# Patient Record
Sex: Female | Born: 1960
Health system: Southern US, Community
[De-identification: ages and names within clinical notes are randomized; demographics above are authoritative.]

## PROBLEM LIST (undated history)

## (undated) DIAGNOSIS — R519 Headache, unspecified: Secondary | ICD-10-CM

## (undated) DIAGNOSIS — F32A Depression, unspecified: Secondary | ICD-10-CM

## (undated) DIAGNOSIS — F419 Anxiety disorder, unspecified: Secondary | ICD-10-CM

## (undated) DIAGNOSIS — G4700 Insomnia, unspecified: Secondary | ICD-10-CM

## (undated) DIAGNOSIS — E7800 Pure hypercholesterolemia, unspecified: Secondary | ICD-10-CM

## (undated) DIAGNOSIS — E669 Obesity, unspecified: Secondary | ICD-10-CM

## (undated) DIAGNOSIS — B019 Varicella without complication: Secondary | ICD-10-CM

## (undated) DIAGNOSIS — I10 Essential (primary) hypertension: Secondary | ICD-10-CM

## (undated) DIAGNOSIS — K760 Fatty (change of) liver, not elsewhere classified: Secondary | ICD-10-CM

## (undated) DIAGNOSIS — K219 Gastro-esophageal reflux disease without esophagitis: Secondary | ICD-10-CM

## (undated) DIAGNOSIS — Z87442 Personal history of urinary calculi: Secondary | ICD-10-CM

## (undated) DIAGNOSIS — R51 Headache: Secondary | ICD-10-CM

## (undated) HISTORY — DX: Varicella without complication: B01.9

## (undated) HISTORY — PX: TUBAL LIGATION: SHX77

---

## 2006-08-05 ENCOUNTER — Ambulatory Visit: Payer: Self-pay | Admitting: Emergency Medicine

## 2006-11-16 ENCOUNTER — Ambulatory Visit: Payer: Self-pay | Admitting: Family Medicine

## 2011-03-24 ENCOUNTER — Ambulatory Visit: Payer: Self-pay

## 2011-10-11 DIAGNOSIS — Z803 Family history of malignant neoplasm of breast: Secondary | ICD-10-CM | POA: Insufficient documentation

## 2014-05-12 ENCOUNTER — Ambulatory Visit: Payer: Self-pay | Admitting: Emergency Medicine

## 2014-07-13 ENCOUNTER — Other Ambulatory Visit: Payer: Self-pay | Admitting: Family Medicine

## 2014-07-13 DIAGNOSIS — M542 Cervicalgia: Secondary | ICD-10-CM

## 2014-12-03 HISTORY — PX: CERVICAL FUSION: SHX112

## 2014-12-21 ENCOUNTER — Ambulatory Visit: Payer: Federal, State, Local not specified - PPO | Admitting: Family Medicine

## 2014-12-28 ENCOUNTER — Encounter: Payer: Self-pay | Admitting: Family Medicine

## 2014-12-28 ENCOUNTER — Ambulatory Visit (INDEPENDENT_AMBULATORY_CARE_PROVIDER_SITE_OTHER): Payer: Federal, State, Local not specified - PPO | Admitting: Family Medicine

## 2014-12-28 VITALS — BP 134/82 | HR 84 | Temp 97.7°F | Ht 62.25 in | Wt 192.5 lb

## 2014-12-28 DIAGNOSIS — Z13220 Encounter for screening for lipoid disorders: Secondary | ICD-10-CM | POA: Diagnosis not present

## 2014-12-28 DIAGNOSIS — I10 Essential (primary) hypertension: Secondary | ICD-10-CM | POA: Insufficient documentation

## 2014-12-28 DIAGNOSIS — E669 Obesity, unspecified: Secondary | ICD-10-CM

## 2014-12-28 DIAGNOSIS — Z130 Encounter for screening for diseases of the blood and blood-forming organs and certain disorders involving the immune mechanism: Secondary | ICD-10-CM

## 2014-12-28 DIAGNOSIS — Z1321 Encounter for screening for nutritional disorder: Secondary | ICD-10-CM

## 2014-12-28 DIAGNOSIS — Z0000 Encounter for general adult medical examination without abnormal findings: Secondary | ICD-10-CM | POA: Diagnosis not present

## 2014-12-28 DIAGNOSIS — Z8679 Personal history of other diseases of the circulatory system: Secondary | ICD-10-CM

## 2014-12-28 DIAGNOSIS — Z1211 Encounter for screening for malignant neoplasm of colon: Secondary | ICD-10-CM

## 2014-12-28 DIAGNOSIS — Z981 Arthrodesis status: Secondary | ICD-10-CM

## 2014-12-28 DIAGNOSIS — Z23 Encounter for immunization: Secondary | ICD-10-CM

## 2014-12-28 LAB — CBC
HCT: 44.2 % (ref 36.0–46.0)
Hemoglobin: 14.5 g/dL (ref 12.0–15.0)
MCHC: 32.9 g/dL (ref 30.0–36.0)
MCV: 89.5 fl (ref 78.0–100.0)
Platelets: 183 10*3/uL (ref 150.0–400.0)
RBC: 4.94 Mil/uL (ref 3.87–5.11)
RDW: 14.6 % (ref 11.5–15.5)
WBC: 5.8 10*3/uL (ref 4.0–10.5)

## 2014-12-28 LAB — LIPID PANEL
Cholesterol: 186 mg/dL (ref 0–200)
HDL: 51.9 mg/dL (ref >39.00)
LDL Cholesterol: 105 mg/dL — ABNORMAL HIGH (ref 0–99)
NonHDL: 133.78
Total CHOL/HDL Ratio: 4
Triglycerides: 143 mg/dL (ref 0.0–149.0)
VLDL: 28.6 mg/dL (ref 0.0–40.0)

## 2014-12-28 LAB — COMPREHENSIVE METABOLIC PANEL
ALT: 20 U/L (ref 0–35)
AST: 19 U/L (ref 0–37)
Albumin: 4.1 g/dL (ref 3.5–5.2)
Alkaline Phosphatase: 89 U/L (ref 39–117)
BUN: 21 mg/dL (ref 6–23)
CO2: 29 mEq/L (ref 19–32)
Calcium: 9.4 mg/dL (ref 8.4–10.5)
Chloride: 108 mEq/L (ref 96–112)
Creatinine, Ser: 0.8 mg/dL (ref 0.40–1.20)
GFR: 79.3 mL/min (ref >60.00)
Glucose, Bld: 80 mg/dL (ref 70–99)
Potassium: 4.4 mEq/L (ref 3.5–5.1)
Sodium: 144 mEq/L (ref 135–145)
Total Bilirubin: 0.5 mg/dL (ref 0.2–1.2)
Total Protein: 7.1 g/dL (ref 6.0–8.3)

## 2014-12-28 LAB — TSH: TSH: 1.49 u[IU]/mL (ref 0.35–4.50)

## 2014-12-28 LAB — HEMOGLOBIN A1C: Hgb A1c MFr Bld: 5.2 % (ref 4.6–6.5)

## 2014-12-28 LAB — VITAMIN D 25 HYDROXY (VIT D DEFICIENCY, FRACTURES): VITD: 13.83 ng/mL — ABNORMAL LOW (ref 30.00–100.00)

## 2014-12-28 NOTE — Assessment & Plan Note (Signed)
Tetanus given today. Obtaining flu shot at work. Labs today: CBC, CMP, lipid, TSH, A1c, vitamin D. Briefly discussed diet. Handout given. Advised clearance from neurosurgeon prior to exercise. Pap smear up-to-date. Mammogram next month. Placing order for colonoscopy.

## 2014-12-28 NOTE — Assessment & Plan Note (Signed)
Prehypertensive today. Will monitor closely.

## 2014-12-28 NOTE — Progress Notes (Signed)
Pre visit review using our clinic review tool, if applicable. No additional management support is needed unless otherwise documented below in the visit note. 

## 2014-12-28 NOTE — Progress Notes (Signed)
Subjective:  Patient ID: Tammy Gregory, female    DOB: 1960/07/21  Age: 54 y.o. MRN: [id number]  CC: Establish care  HPI Tammy Gregory is a 54 y.o. female presents to the clinic today to establish care.  Preventative Healthcare  Pap smear: 07/2013.  Mammogram/Breast exam: Has upcoming mammogram in November.  Colonoscopy: In need of.  Immunizations: Will get flu at work. In need of tetanus today.  Labs: In need of screening labs today. See orders.  Diet/Exercise: Has been eating poorly and has been unable to exercise following C spine surgery 08/2014.  Alcohol use: See below.   Smoking/tobacco use: No.  STD/HIV testing: Has had prior testing.   Wears seat belt: Yes.   PMH, Surgical Hx, Family Hx, Social History reviewed and updated as below. Past Medical History  Diagnosis Date  . Chicken pox    Past Surgical History  Procedure Laterality Date  . Cesarean section      3  . Cervical fusion  12/03/2014   Family History  Problem Relation Age of Onset  . Hypertension Father    Social History  Substance Use Topics  . Smoking status: Never Smoker   . Smokeless tobacco: Never Used  . Alcohol Use: 0.6 - 1.2 oz/week    1-2 Standard drinks or equivalent per week   Review of Systems  HENT: Positive for trouble swallowing.   Gastrointestinal: Positive for constipation.  Musculoskeletal:       Joint pain. Muscle pain.  Neurological: Positive for headaches.  Psychiatric/Behavioral: The patient is nervous/anxious.    All other systems negative.   Objective:   Today's Vitals: BP 134/82 mmHg  Pulse 84  Temp(Src) 97.7 F (36.5 C) (Oral)  Ht 5' 2.25" (1.581 m)  Wt 192 lb 8 oz (87.317 kg)  BMI 34.93 kg/m2  SpO2 97%  Physical Exam  Constitutional: She is oriented to person, place, and time. She appears well-developed and well-nourished. No distress.  HENT:  Head: Normocephalic and atraumatic.  Nose: Nose normal.  Mouth/Throat: Oropharynx is clear  and moist. No oropharyngeal exudate.  Normal TM's bilaterally.   Eyes: Conjunctivae are normal. No scleral icterus.  Neck: Neck supple. No thyromegaly present.  Surgical scar from recent cervical fusion is well-healed.  Cardiovascular: Normal rate and regular rhythm.   No murmur heard. Pulmonary/Chest: Effort normal and breath sounds normal. She has no wheezes. She has no rales.  Abdominal: Soft. She exhibits no distension. There is no tenderness. There is no rebound and no guarding.  Musculoskeletal: Normal range of motion. She exhibits no edema.  Lymphadenopathy:    She has no cervical adenopathy.  Neurological: She is alert and oriented to person, place, and time.  Skin: Skin is warm and dry. No rash noted.  Psychiatric: She has a normal mood and affect.  Vitals reviewed.  Assessment & Plan:   Problem List Items Addressed This Visit    History of hypertension    Prehypertensive today. Will monitor closely.       Relevant Orders   Comprehensive metabolic panel   Obesity (BMI 35.0-39.9 without comorbidity) ([place])   Relevant Orders   Hemoglobin A1c   TSH   Preventative health care - Primary    Tetanus given today. Obtaining flu shot at work. Labs today: CBC, CMP, lipid, TSH, A1c, vitamin D. Briefly discussed diet. Handout given. Advised clearance from neurosurgeon prior to exercise. Pap smear up-to-date. Mammogram next month. Placing order for colonoscopy.      S/P cervical  spinal fusion    Other Visit Diagnoses    Screening for deficiency anemia        Relevant Orders    CBC    Screening for lipid disorders        Relevant Orders    Lipid panel    Encounter for vitamin deficiency screening        Relevant Orders    Vitamin D (25 hydroxy)    Need for prophylactic vaccination with combined diphtheria-tetanus-pertussis (DTP) vaccine        Relevant Orders    Tdap vaccine greater than or equal to 7yo IM (Completed)    Encounter for screening colonoscopy         Relevant Orders    Ambulatory referral to Gastroenterology      Outpatient Encounter Prescriptions as of 12/28/2014  Medication Sig  . diclofenac (VOLTAREN) 75 MG EC tablet Take 75 mg by mouth 2 (two) times daily.  Marland Kitchen gabapentin (NEURONTIN) 300 MG capsule 3 (three) times daily.  . methocarbamol (ROBAXIN) 500 MG tablet 4 (four) times daily.  . traMADol (ULTRAM) 50 MG tablet Take 50 mg by mouth 3 (three) times daily.   No facility-administered encounter medications on file as of 12/28/2014.    Follow-up: 6 months.    Coral Spikes DO

## 2014-12-28 NOTE — Patient Instructions (Signed)
I was nice to see you today.  Continue your current medications. Be cautious about long term use of the diclofenac (this is an NSAID and can cause kidney damage and GI bleed).  Follow up in ~ 6 months or sooner if needed.   Take care  Dr. Lacinda Axon

## 2015-02-08 ENCOUNTER — Other Ambulatory Visit: Payer: Self-pay | Admitting: Family Medicine

## 2015-02-08 ENCOUNTER — Encounter: Payer: Self-pay | Admitting: Family Medicine

## 2015-02-08 MED ORDER — VITAMIN D (ERGOCALCIFEROL) 1.25 MG (50000 UNIT) PO CAPS: 50000.0000 [IU] | ORAL_CAPSULE | ORAL | Status: DC

## 2015-03-23 ENCOUNTER — Ambulatory Visit: Payer: Federal, State, Local not specified - PPO | Admitting: Physical Therapy

## 2015-03-25 ENCOUNTER — Encounter: Payer: Federal, State, Local not specified - PPO | Admitting: Physical Therapy

## 2015-03-30 ENCOUNTER — Encounter: Payer: Federal, State, Local not specified - PPO | Admitting: Physical Therapy

## 2015-04-01 ENCOUNTER — Encounter: Payer: Federal, State, Local not specified - PPO | Admitting: Physical Therapy

## 2015-04-01 ENCOUNTER — Encounter: Payer: Self-pay | Admitting: *Deleted

## 2015-04-02 ENCOUNTER — Ambulatory Visit: Payer: Federal, State, Local not specified - PPO | Admitting: Certified Registered Nurse Anesthetist

## 2015-04-02 ENCOUNTER — Encounter
Admission: RE | Disposition: A | Discharge status: Home / Self Care | Payer: Self-pay | Source: Ambulatory Visit | Attending: Gastroenterology

## 2015-04-02 ENCOUNTER — Encounter: Payer: Self-pay | Admitting: *Deleted

## 2015-04-02 ENCOUNTER — Ambulatory Visit
Admission: RE | Admit: 2015-04-02 | Discharge: 2015-04-02 | Disposition: A | Discharge status: Home / Self Care | Payer: Federal, State, Local not specified - PPO | Source: Ambulatory Visit | Attending: Gastroenterology | Admitting: Gastroenterology

## 2015-04-02 DIAGNOSIS — R51 Headache: Secondary | ICD-10-CM | POA: Insufficient documentation

## 2015-04-02 DIAGNOSIS — I10 Essential (primary) hypertension: Secondary | ICD-10-CM | POA: Insufficient documentation

## 2015-04-02 DIAGNOSIS — D127 Benign neoplasm of rectosigmoid junction: Secondary | ICD-10-CM | POA: Diagnosis not present

## 2015-04-02 DIAGNOSIS — Z981 Arthrodesis status: Secondary | ICD-10-CM | POA: Insufficient documentation

## 2015-04-02 DIAGNOSIS — Z1211 Encounter for screening for malignant neoplasm of colon: Secondary | ICD-10-CM | POA: Diagnosis not present

## 2015-04-02 DIAGNOSIS — Z800 Family history of malignant neoplasm of digestive organs: Secondary | ICD-10-CM | POA: Diagnosis not present

## 2015-04-02 DIAGNOSIS — Z880 Allergy status to penicillin: Secondary | ICD-10-CM | POA: Diagnosis not present

## 2015-04-02 DIAGNOSIS — K621 Rectal polyp: Secondary | ICD-10-CM | POA: Diagnosis not present

## 2015-04-02 DIAGNOSIS — Z79899 Other long term (current) drug therapy: Secondary | ICD-10-CM | POA: Diagnosis not present

## 2015-04-02 DIAGNOSIS — K5730 Diverticulosis of large intestine without perforation or abscess without bleeding: Secondary | ICD-10-CM | POA: Insufficient documentation

## 2015-04-02 HISTORY — PX: COLONOSCOPY WITH PROPOFOL: SHX5780

## 2015-04-02 HISTORY — DX: Essential (primary) hypertension: I10

## 2015-04-02 HISTORY — DX: Headache: R51

## 2015-04-02 HISTORY — DX: Headache, unspecified: R51.9

## 2015-04-02 LAB — HM COLONOSCOPY: HM Colonoscopy: 33

## 2015-04-02 SURGERY — COLONOSCOPY WITH PROPOFOL
Anesthesia: General

## 2015-04-02 MED ORDER — SODIUM CHLORIDE 0.9 % IV SOLN
INTRAVENOUS | Status: DC
  Administered 2015-04-02: 09:00:00 via INTRAVENOUS

## 2015-04-02 MED ORDER — PROPOFOL 500 MG/50ML IV EMUL
INTRAVENOUS | Status: DC | PRN
  Administered 2015-04-02: 140 ug/kg/min via INTRAVENOUS

## 2015-04-02 MED ORDER — SODIUM CHLORIDE 0.9 % IV SOLN: INTRAVENOUS | Status: DC

## 2015-04-02 MED ORDER — MIDAZOLAM HCL 2 MG/2ML IJ SOLN
INTRAMUSCULAR | Status: DC | PRN
  Administered 2015-04-02: 1 mg via INTRAVENOUS

## 2015-04-02 MED ORDER — LIDOCAINE HCL (CARDIAC) 20 MG/ML IV SOLN
INTRAVENOUS | Status: DC | PRN
  Administered 2015-04-02: 60 mg via INTRAVENOUS

## 2015-04-02 MED ORDER — PROPOFOL 10 MG/ML IV BOLUS
INTRAVENOUS | Status: DC | PRN
Start: 2015-04-02 — End: 2015-04-02
  Administered 2015-04-02: 30 mg via INTRAVENOUS
  Administered 2015-04-02: 20 mg via INTRAVENOUS
  Administered 2015-04-02: 30 mg via INTRAVENOUS

## 2015-04-02 NOTE — Anesthesia Preprocedure Evaluation (Signed)
Anesthesia Evaluation  Patient identified by MRN, date of birth, ID band Patient awake    Reviewed: Allergy & Precautions, H&P , NPO status , Patient's Chart, lab work & pertinent test results  History of Anesthesia Complications Negative for: history of anesthetic complications  Airway Mallampati: II  TM Distance: >3 FB Neck ROM: limited    Dental  (+) Poor Dentition   Pulmonary neg pulmonary ROS, neg shortness of breath,    Pulmonary exam normal breath sounds clear to auscultation       Cardiovascular Exercise Tolerance: Good hypertension, (-) angina(-) Past MI and (-) DOE Normal cardiovascular exam Rhythm:regular Rate:Normal     Neuro/Psych  Headaches, negative psych ROS   GI/Hepatic negative GI ROS, Neg liver ROS, neg GERD  ,  Endo/Other  negative endocrine ROS  Renal/GU negative Renal ROS  negative genitourinary   Musculoskeletal   Abdominal   Peds  Hematology negative hematology ROS (+)   Anesthesia Other Findings Past Medical History:   Chicken pox                                                  Hypertension                                                 Headache                                                    Past Surgical History:   CESAREAN SECTION                                                Comment:3   CERVICAL FUSION                                  12/03/2014   BMI    Body Mass Index   35.10 kg/m 2      Reproductive/Obstetrics negative OB ROS                             Anesthesia Physical Anesthesia Plan  ASA: III  Anesthesia Plan: General   Post-op Pain Management:    Induction:   Airway Management Planned:   Additional Equipment:   Intra-op Plan:   Post-operative Plan:   Informed Consent: I have reviewed the patients History and Physical, chart, labs and discussed the procedure including the risks, benefits and alternatives for the proposed  anesthesia with the patient or authorized representative who has indicated his/her understanding and acceptance.   Dental Advisory Given  Plan Discussed with: Anesthesiologist, CRNA and Surgeon  Anesthesia Plan Comments:         Anesthesia Quick Evaluation

## 2015-04-02 NOTE — Anesthesia Postprocedure Evaluation (Signed)
Anesthesia Post Note  Patient: Tammy Gregory  Procedure(s) Performed: Procedure(s) (LRB): COLONOSCOPY WITH PROPOFOL (N/A)  Patient location during evaluation: Endoscopy Anesthesia Type: General Level of consciousness: awake and alert Pain management: pain level controlled Vital Signs Assessment: post-procedure vital signs reviewed and stable Respiratory status: spontaneous breathing, nonlabored ventilation, respiratory function stable and patient connected to nasal cannula oxygen Cardiovascular status: blood pressure returned to baseline and stable Postop Assessment: no signs of nausea or vomiting Anesthetic complications: no    Last Vitals:  Filed Vitals:   04/02/15 1005 04/02/15 1015  BP: 160/80 163/90  Pulse: 58 62  Temp:    Resp: 24 20    Last Pain: There were no vitals filed for this visit.               Precious Haws Latesia Norrington

## 2015-04-02 NOTE — H&P (Signed)
Outpatient short stay form Pre-procedure 04/02/2015 9:03 AM Tammy Sails MD  Primary Physician: Dr Thersa Salt  Reason for visit:  Screening colonoscopy  History of present illness:  Patient is a 55 year old female presenting today for her initial screening colonoscopy. She tolerated her prep well. She takes no aspirin or blood thinning products. Family history is unclear in regards to some type of stomach cancer.    Current facility-administered medications:  .  0.9 %  sodium chloride infusion, , Intravenous, Continuous, Tammy Sails, MD, Last Rate: 20 mL/hr at 04/02/15 0839 .  0.9 %  sodium chloride infusion, , Intravenous, Continuous, Tammy Sails, MD  Prescriptions prior to admission  Medication Sig Dispense Refill Last Dose  . gabapentin (NEURONTIN) 300 MG capsule 3 (three) times daily.   04/01/2015 at 1100  . diclofenac (VOLTAREN) 75 MG EC tablet Take 75 mg by mouth 2 (two) times daily.     . methocarbamol (ROBAXIN) 500 MG tablet 4 (four) times daily.     . traMADol (ULTRAM) 50 MG tablet Take 50 mg by mouth 3 (three) times daily.  1   . Vitamin D, Ergocalciferol, (DRISDOL) 50000 UNITS CAPS capsule Take 1 capsule (50,000 Units total) by mouth every 7 (seven) days. 8 capsule 0      Allergies  Allergen Reactions  . Codeine Nausea Only, Other (See Comments) and Nausea And Vomiting    Other Reaction: DIZZINESS.  Marland Kitchen Amoxicillin Diarrhea  . Clindamycin Other (See Comments)     Past Medical History  Diagnosis Date  . Chicken pox   . Hypertension   . Headache     Review of systems:      Physical Exam    Heart and lungs: Regular rate and rhythm without rub or gallop, lungs are bilaterally clear.    HEENT: Norm septic atraumatic eyes are anicteric    Other:     Pertinant exam for procedure: Soft nontender nondistended bowel sounds positive normoactive.    Planned proceedures: Colonoscopy and indicated procedures. I have discussed the risks benefits and  complications of procedures to include not limited to bleeding, infection, perforation and the risk of sedation and the patient wishes to proceed.    Tammy Sails, MD Gastroenterology 04/02/2015  9:03 AM

## 2015-04-02 NOTE — Anesthesia Procedure Notes (Signed)
Date/Time: 04/02/2015 9:11 AM Performed by: Johnna Acosta Pre-anesthesia Checklist: Patient identified, Emergency Drugs available, Suction available and Patient being monitored Patient Re-evaluated:Patient Re-evaluated prior to inductionOxygen Delivery Method: Nasal cannula

## 2015-04-02 NOTE — Transfer of Care (Signed)
Immediate Anesthesia Transfer of Care Note  Patient: Tammy Gregory  Procedure(s) Performed: Procedure(s): COLONOSCOPY WITH PROPOFOL (N/A)  Patient Location: PACU  Anesthesia Type:General  Level of Consciousness: sedated  Airway & Oxygen Therapy: Patient Spontanous Breathing and Patient connected to nasal cannula oxygen  Post-op Assessment: Report given to RN and Post -op Vital signs reviewed and stable  Post vital signs: Reviewed and stable  Last Vitals:  Filed Vitals:   04/02/15 0820  BP: 139/85  Pulse: 75  Temp: 37.1 C  Resp: 16    Complications: No apparent anesthesia complications

## 2015-04-02 NOTE — Op Note (Signed)
[facility] Gastroenterology Patient Name: Marion Coxe Procedure Date: 04/02/2015 9:09 AM MRN: [id number] Account #: [id number] Date of Birth: 17-Apr-1960 Admit Type: Outpatient Age: 55 Room: [facility] ENDO ROOM 3 Gender: Female Note Status: Finalized Procedure:         Colonoscopy Indications:       Screening for colorectal malignant neoplasm, This is the                     patient's first colonoscopy Providers:         Lollie Sails, MD Referring MD:      Barnie Del. Lacinda Axon (Referring MD) Medicines:         Monitored Anesthesia Care Complications:     No immediate complications. Procedure:         Pre-Anesthesia Assessment:                    - ASA Grade Assessment: II - A patient with mild systemic                     disease.                    After obtaining informed consent, the colonoscope was                     passed under direct vision. Throughout the procedure, the                     patient's blood pressure, pulse, and oxygen saturations                     were monitored continuously. The Colonoscope was                     introduced through the anus and advanced to the the cecum,                     identified by appendiceal orifice and ileocecal valve. The                     colonoscopy was performed with moderate difficulty due to                     poor bowel prep and restricted mobility of the colon.                     Successful completion of the procedure was aided by                     applying abdominal pressure and lavage. The patient                     tolerated the procedure well. The quality of the bowel                     preparation was fair. Findings:      A 3 mm polyp was found in the recto-sigmoid colon. The polyp was       sessile. The polyp was removed with a cold biopsy forceps. Resection and       retrieval were complete.      A 4 mm polyp was found in the rectum. The polyp was semi-pedunculated.       The polyp  was removed with a cold  snare. Resection and retrieval were       complete.      Multiple medium-mouthed diverticula were found in the sigmoid colon, in       the descending colon and in the transverse colon.      The retroflexed view of the distal rectum and anal verge was normal and       showed no anal or rectal abnormalities.      The exam was otherwise without abnormality.      The digital rectal exam was normal. Impression:        - One 3 mm polyp at the recto-sigmoid colon. Resected and                     retrieved.                    - One 4 mm polyp in the rectum. Resected and retrieved.                    - Diverticulosis in the sigmoid colon, in the descending                     colon and in the transverse colon.                    - The distal rectum and anal verge are normal on                     retroflexion view.                    - The examination was otherwise normal. Recommendation:    - Discharge patient to home.                    - Telephone GI clinic for pathology results in 1 week. Procedure Code(s): --- Professional ---                    [phone], Colonoscopy, flexible; with removal of tumor(s),                     polyp(s), or other lesion(s) by snare technique                    45380, 80, Colonoscopy, flexible; with biopsy, single or                     multiple Diagnosis Code(s): --- Professional ---                    Z12.11, Encounter for screening for malignant neoplasm of                     colon                    D12.7, Benign neoplasm of rectosigmoid junction                    K62.1, Rectal polyp                    K57.30, Diverticulosis of large intestine without                     perforation or abscess without bleeding CPT copyright 2014 American Medical Association. All rights reserved. The codes documented in  this report are preliminary and upon coder review may  be revised to meet current compliance requirements. Lollie Sails,  MD 04/02/2015 9:43:01 AM This report has been signed electronically. Number of Addenda: 0 Note Initiated On: 04/02/2015 9:09 AM Scope Withdrawal Time: 0 hours 12 minutes 24 seconds  Total Procedure Duration: 0 hours 22 minutes 22 seconds       [facility]

## 2015-04-03 ENCOUNTER — Encounter: Payer: Self-pay | Admitting: Gastroenterology

## 2015-04-05 LAB — SURGICAL PATHOLOGY

## 2015-04-06 ENCOUNTER — Encounter: Payer: Federal, State, Local not specified - PPO | Admitting: Physical Therapy

## 2015-04-08 ENCOUNTER — Encounter: Payer: Federal, State, Local not specified - PPO | Admitting: Physical Therapy

## 2015-04-09 ENCOUNTER — Encounter: Payer: Self-pay | Admitting: Family Medicine

## 2015-06-28 ENCOUNTER — Ambulatory Visit: Payer: Federal, State, Local not specified - PPO | Admitting: Family Medicine

## 2015-07-23 ENCOUNTER — Encounter: Payer: Self-pay | Admitting: Family Medicine

## 2015-07-23 ENCOUNTER — Ambulatory Visit (INDEPENDENT_AMBULATORY_CARE_PROVIDER_SITE_OTHER): Payer: Federal, State, Local not specified - PPO | Admitting: Family Medicine

## 2015-07-23 VITALS — BP 146/94 | HR 74 | Temp 98.3°F | Ht 62.25 in | Wt 201.0 lb

## 2015-07-23 DIAGNOSIS — R635 Abnormal weight gain: Secondary | ICD-10-CM

## 2015-07-23 DIAGNOSIS — F320 Major depressive disorder, single episode, mild: Secondary | ICD-10-CM

## 2015-07-23 DIAGNOSIS — F329 Major depressive disorder, single episode, unspecified: Secondary | ICD-10-CM | POA: Diagnosis not present

## 2015-07-23 DIAGNOSIS — F32A Depression, unspecified: Secondary | ICD-10-CM

## 2015-07-23 DIAGNOSIS — F419 Anxiety disorder, unspecified: Secondary | ICD-10-CM | POA: Insufficient documentation

## 2015-07-23 NOTE — Progress Notes (Signed)
   Subjective:  Patient ID: Tammy Gregory, female    DOB: 1960-09-23  Age: 56 y.o. MRN: [id number]  CC: Lack of motivation/drive, Discuss weight  HPI:  55 year old female with obesity and history of hypertension presents with the above complaints.  Patient states that she has recently been experiencing lack of motivation and drive. Patient states that she is uninterested in doing things. Doesn't want to go to the gym. Is not cooking very much (she enjoys this). She's feeling down. She states she's worried about her husband in his health issues. No other new stressors. No known relieving factors. No other exacerbating factors other than her husband's health.  Additionally, patient states that she has gained weight over the past several months. She states that she's eating poorly and is not exercising. She would like to discuss treatment options today.  Social Hx   Social History   Social History  . Marital Status: Married    Spouse Name: N/A  . Number of Children: N/A  . Years of Education: N/A   Social History Main Topics  . Smoking status: Never Smoker   . Smokeless tobacco: Never Used  . Alcohol Use: 0.6 - 1.2 oz/week    1-2 Standard drinks or equivalent per week  . Drug Use: No  . Sexual Activity:    Partners: Male   Other Topics Concern  . None   Social History Narrative   Review of Systems  Constitutional:       Weight gain.  Psychiatric/Behavioral:       Lack of motivation/drive.   Objective:  BP 146/94 mmHg  Pulse 74  Temp(Src) 98.3 F (36.8 C) (Oral)  Ht 5' 2.25" (1.581 m)  Wt 201 lb (91.173 kg)  BMI 36.48 kg/m2  SpO2 96%  LMP 04/02/2007 (Approximate)  BP/Weight 07/23/2015 04/02/2015 [id number]  Systolic BP [id number] [id number]  Diastolic BP 94 90 82  Wt. (Lbs) 201 192 192.5  BMI 36.48 35.11 34.93   Physical Exam  Constitutional: She is oriented to person, place, and time. She appears well-developed. No distress.  Cardiovascular: Normal rate and  regular rhythm.   Pulmonary/Chest: Effort normal. She has no wheezes. She has no rales.  Neurological: She is alert and oriented to person, place, and time.  Psychiatric: She has a normal mood and affect.  Vitals reviewed.  Lab Results  Component Value Date   WBC 5.8 12/28/2014   HGB 14.5 12/28/2014   HCT 44.2 12/28/2014   PLT 183.0 12/28/2014   GLUCOSE 80 12/28/2014   CHOL 186 12/28/2014   TRIG 143.0 12/28/2014   HDL 51.90 12/28/2014   LDLCALC 105* 12/28/2014   ALT 20 12/28/2014   AST 19 12/28/2014   NA 144 12/28/2014   K 4.4 12/28/2014   CL 108 12/28/2014   CREATININE 0.80 12/28/2014   BUN 21 12/28/2014   CO2 29 12/28/2014   TSH 1.49 12/28/2014   HGBA1C 5.2 12/28/2014   PHQ-9 - 9.   Assessment & Plan:   Problem List Items Addressed This Visit    Weight gain    New problem. Discussed diet and exercise. Sending to nutrition.      Mild depression - Primary    New problem. Patient with symptoms of depression. PHQ9 - 9. Discussed today. Offered treatment and patient declined.        Follow-up: 3 months.  [place]

## 2015-07-23 NOTE — Assessment & Plan Note (Signed)
New problem. Patient with symptoms of depression. PHQ9 - 9. Discussed today. Offered treatment and patient declined.

## 2015-07-23 NOTE — Assessment & Plan Note (Signed)
New problem. Discussed diet and exercise. Sending to nutrition.

## 2015-07-23 NOTE — Patient Instructions (Signed)
We will call about your nutritionist appointment.  Take about treatment regarding depression.  Follow-up in 3 months.  Take care  Dr. Lacinda Axon

## 2015-09-03 ENCOUNTER — Encounter: Payer: Federal, State, Local not specified - PPO | Attending: Family Medicine | Admitting: Dietician

## 2015-09-03 VITALS — Ht 63.0 in | Wt 199.5 lb

## 2015-09-03 DIAGNOSIS — R635 Abnormal weight gain: Secondary | ICD-10-CM | POA: Diagnosis not present

## 2015-09-03 DIAGNOSIS — E669 Obesity, unspecified: Secondary | ICD-10-CM | POA: Diagnosis not present

## 2015-09-03 NOTE — Patient Instructions (Signed)
   Work on portion control.  Choose lower fat and/ sugar food choices.   Continue with or increase the amount of exercise.

## 2015-09-03 NOTE — Progress Notes (Signed)
Medical Nutrition Therapy: Visit start time: 1100  end time: 1230  Assessment:  Diagnosis: abnormal weight gain Past medical history: none significant per patient Psychosocial issues/ stress concerns: reports moderate stress, job-related Preferred learning method:  . Auditory  Current weight: 199.5lbs  Height: 5'3" Medications, supplements: updated list in chart   Progress and evaluation: Patient reports weight gain over the past 2-3 years, since changing to a more sedentary job, and having back surgery.          She states she lost down to about 180lbs 3-4 years ago, but has had difficulty finding motivation to resume weight loss efforts recently.         Her husband Marchia Bond (also present at visit) wants to work on weight loss as well.   Physical activity: elliptical, abdominal work 20-30 minutes, 2-3 times a week  Dietary Intake:  Usual eating pattern includes 3 meals and 2-3 snacks per day. Dining out frequency: average 5 meals per week.  Breakfast: brings thermos of coffee to work, drinks throughout the day. English muffin or wasa crackers with peanut butter; or boiled eggs  Snack: 9-10am sometimes finishes breakfast. 11am fruit or other starchy foods Lunch: 1-2pm brings from home -- salad, chicken and veg., tuna salad sandwich, celery or carrots occasionally  Snack: fruit apple, orange, or grapes, or granola bar [place] Supper: increased salads and vegetables, occasional spaghetti or rice (not daily anymore). Rotisserie chicken, baked meats. Recently some burgers or fried chicken and french fries.  Snack: sweets, ice cream Beverages: flavored water (with [place])  Nutrition Care Education: Topics covered: weight management Basic nutrition: basic food groups, appropriate nutrient balance, appropriate meal and snack schedule, basic meal planning Weight control: determining reasonable weight goal, behavioral changes for weight loss: portion control, emotional eating; guide for  1300kcal daily intake; benefits of tracking food intake and weighing regularly.  Other lifestyle changes:  benefits of regular exercise  Nutritional Diagnosis:  [place]-3.3 Overweight/obesity As related to excess calories, history of inactivity.  As evidenced by patient report.  Intervention: Instruction as noted above.   Set goals with patient input.    She and her husband will be working together on weight loss efforts.   Education Materials given:  . Food lists/ Planning A Balanced Meal . Sample meal pattern/ menus: Quick and Healthy Meal Ideas . Goals/ instructions  Learner/ who was taught:  . Patient  . Spouse/ partner  Level of understanding: Marland Kitchen Verbalizes/ demonstrates competency  Demonstrated degree of understanding via:   Teach back Learning barriers: . None  Willingness to learn/ readiness for change: . Acceptance, ready for change   Monitoring and Evaluation:  Dietary intake, exercise, and body weight      follow up: 10/01/15

## 2015-10-01 ENCOUNTER — Ambulatory Visit: Payer: Federal, State, Local not specified - PPO | Admitting: Dietician

## 2015-10-06 ENCOUNTER — Telehealth: Payer: Self-pay | Admitting: Dietician

## 2015-10-06 NOTE — Telephone Encounter (Signed)
Called patient to reschedule appointment, which she missed on 10/01/15. She was unable to access her schedule, but will call back to reschedule.

## 2015-10-12 ENCOUNTER — Encounter: Payer: Self-pay | Admitting: Dietician

## 2015-10-12 NOTE — Progress Notes (Signed)
Have not heard back from patient to reschedule appointment which was missed on 10/01/15. Sent discharge letter to MD.

## 2015-10-29 ENCOUNTER — Ambulatory Visit: Payer: Federal, State, Local not specified - PPO | Admitting: Family Medicine

## 2015-11-26 ENCOUNTER — Encounter: Payer: Self-pay | Admitting: Family Medicine

## 2015-11-26 ENCOUNTER — Ambulatory Visit (INDEPENDENT_AMBULATORY_CARE_PROVIDER_SITE_OTHER): Payer: Federal, State, Local not specified - PPO | Admitting: Family Medicine

## 2015-11-26 DIAGNOSIS — F32A Depression, unspecified: Secondary | ICD-10-CM

## 2015-11-26 DIAGNOSIS — F320 Major depressive disorder, single episode, mild: Secondary | ICD-10-CM

## 2015-11-26 DIAGNOSIS — E669 Obesity, unspecified: Secondary | ICD-10-CM | POA: Diagnosis not present

## 2015-11-26 DIAGNOSIS — F329 Major depressive disorder, single episode, unspecified: Secondary | ICD-10-CM

## 2015-11-26 NOTE — Patient Instructions (Signed)
Hold each other accountable.   Try and watch your diet and exercise.  Take care  Dr. Lacinda Axon

## 2015-11-26 NOTE — Assessment & Plan Note (Signed)
Stable.  Will continue to monitor closely. 

## 2015-11-26 NOTE — Assessment & Plan Note (Signed)
Stable. No significant weight loss. Spent a great deal of time with patient and her husband discussing lifestyle and dietary changes to achieve weight loss.

## 2015-11-26 NOTE — Progress Notes (Signed)
Pre visit review using our clinic review tool, if applicable. No additional management support is needed unless otherwise documented below in the visit note. 

## 2015-11-26 NOTE — Progress Notes (Signed)
   Subjective:  Patient ID: Tammy Gregory, female    DOB: 18-Sep-1960  Age: 55 y.o. MRN: [id number]  CC: Follow up  HPI:  55 year old female with a history of HTN, depression, and obesity presents for follow up.  Obesity  No significant weight loss.  She has seen nutrition.  She and her husband are still struggling with exercise and healthy eating.  Depression  Stable per report.  She has been experiencing stress as of late as her family in [place] is currently being hit by [employer].   Social Hx   Social History   Social History  . Marital status: Married    Spouse name: N/A  . Number of children: N/A  . Years of education: N/A   Social History Main Topics  . Smoking status: Never Smoker  . Smokeless tobacco: Never Used  . Alcohol use 0.6 - 1.2 oz/week    1 - 2 Standard drinks or equivalent per week  . Drug use: No  . Sexual activity: Yes    Partners: Male   Other Topics Concern  . None   Social History Narrative  . None   Review of Systems  Constitutional: Negative.   Psychiatric/Behavioral: The patient is nervous/anxious.    Objective:  BP 128/88 (BP Location: Right Arm, Patient Position: Sitting, Cuff Size: Large)   Pulse 76   Temp 98.1 F (36.7 C) (Oral)   Wt 197 lb 6 oz (89.5 kg)   LMP 04/02/2007 (Approximate)   SpO2 93%   BMI 34.96 kg/m   BP/Weight 11/26/2015 09/03/2015 123456  Systolic BP 0000000 - 123456  Diastolic BP 88 - 94  Wt. (Lbs) 197.38 199.5 201  BMI 34.96 35.35 36.48   Physical Exam  Constitutional: She is oriented to person, place, and time. She appears well-developed. No distress.  Cardiovascular: Normal rate and regular rhythm.   Pulmonary/Chest: Effort normal and breath sounds normal.  Neurological: She is alert and oriented to person, place, and time.  Psychiatric: She has a normal mood and affect.  Vitals reviewed.  Lab Results  Component Value Date   WBC 5.8 12/28/2014   HGB 14.5 12/28/2014   HCT 44.2  12/28/2014   PLT 183.0 12/28/2014   GLUCOSE 80 12/28/2014   CHOL 186 12/28/2014   TRIG 143.0 12/28/2014   HDL 51.90 12/28/2014   LDLCALC 105 (H) 12/28/2014   ALT 20 12/28/2014   AST 19 12/28/2014   NA 144 12/28/2014   K 4.4 12/28/2014   CL 108 12/28/2014   CREATININE 0.80 12/28/2014   BUN 21 12/28/2014   CO2 29 12/28/2014   TSH 1.49 12/28/2014   HGBA1C 5.2 12/28/2014    Assessment & Plan:   Problem List Items Addressed This Visit    Mild depression    Stable. Will continue to monitor closely.       Obesity (BMI 35.0-39.9 without comorbidity) (HCC)    Stable. No significant weight loss. Spent a great deal of time with patient and her husband discussing lifestyle and dietary changes to achieve weight loss.       Other Visit Diagnoses   None.     Follow-up: Return for 6 months - 1 year. .  15 minutes were spent face-to-face with the patient during this encounter and over half of that time was spent on counseling regarding lifestyle changes/diet/exercise to achieve weight loss goals.  [place]

## 2016-02-24 ENCOUNTER — Encounter: Payer: Self-pay | Admitting: Family Medicine

## 2016-02-24 ENCOUNTER — Ambulatory Visit (INDEPENDENT_AMBULATORY_CARE_PROVIDER_SITE_OTHER): Payer: Federal, State, Local not specified - PPO | Admitting: Family Medicine

## 2016-02-24 VITALS — BP 139/84 | HR 89 | Temp 98.3°F | Resp 14 | Wt 194.8 lb

## 2016-02-24 DIAGNOSIS — E7800 Pure hypercholesterolemia, unspecified: Secondary | ICD-10-CM | POA: Diagnosis not present

## 2016-02-24 DIAGNOSIS — M791 Myalgia: Secondary | ICD-10-CM | POA: Diagnosis not present

## 2016-02-24 DIAGNOSIS — M7918 Myalgia, other site: Secondary | ICD-10-CM

## 2016-02-24 DIAGNOSIS — R1010 Upper abdominal pain, unspecified: Secondary | ICD-10-CM | POA: Diagnosis not present

## 2016-02-24 DIAGNOSIS — F5101 Primary insomnia: Secondary | ICD-10-CM

## 2016-02-24 MED ORDER — ONDANSETRON HCL 4 MG PO TABS: 4.0000 mg | ORAL_TABLET | Freq: Three times a day (TID) | ORAL | 0 refills | Status: DC | PRN

## 2016-02-24 MED ORDER — TRAZODONE HCL 50 MG PO TABS: 50.0000 mg | ORAL_TABLET | Freq: Every evening | ORAL | 3 refills | Status: DC | PRN

## 2016-02-24 MED ORDER — CYCLOBENZAPRINE HCL 10 MG PO TABS: 10.0000 mg | ORAL_TABLET | Freq: Three times a day (TID) | ORAL | 0 refills | Status: DC | PRN

## 2016-02-24 MED ORDER — CYCLOBENZAPRINE HCL 5 MG PO TABS: 5.0000 mg | ORAL_TABLET | Freq: Three times a day (TID) | ORAL | 0 refills | Status: DC | PRN

## 2016-02-24 NOTE — Progress Notes (Signed)
Pre visit review using our clinic review tool, if applicable. No additional management support is needed unless otherwise documented below in the visit note. 

## 2016-02-24 NOTE — Patient Instructions (Signed)
Trazodone for sleep.  Flexeril 3 times daily as needed for spasm/pain in shoulder.  We will call with your lab results.  If your pain worsens, go to the ED.  Take care  Dr. Lacinda Axon

## 2016-02-25 LAB — COMPREHENSIVE METABOLIC PANEL
ALT: 16 U/L (ref 0–35)
AST: 20 U/L (ref 0–37)
Albumin: 4.2 g/dL (ref 3.5–5.2)
Alkaline Phosphatase: 81 U/L (ref 39–117)
BUN: 13 mg/dL (ref 6–23)
CO2: 31 mEq/L (ref 19–32)
Calcium: 9 mg/dL (ref 8.4–10.5)
Chloride: 104 mEq/L (ref 96–112)
Creatinine, Ser: 0.85 mg/dL (ref 0.40–1.20)
GFR: 73.62 mL/min (ref >60.00)
Glucose, Bld: 93 mg/dL (ref 70–99)
Potassium: 4 mEq/L (ref 3.5–5.1)
Sodium: 141 mEq/L (ref 135–145)
Total Bilirubin: 0.4 mg/dL (ref 0.2–1.2)
Total Protein: 7 g/dL (ref 6.0–8.3)

## 2016-02-25 LAB — CBC
HCT: 42.7 % (ref 36.0–46.0)
Hemoglobin: 14.3 g/dL (ref 12.0–15.0)
MCHC: 33.5 g/dL (ref 30.0–36.0)
MCV: 87.6 fl (ref 78.0–100.0)
Platelets: 171 10*3/uL (ref 150.0–400.0)
RBC: 4.87 Mil/uL (ref 3.87–5.11)
RDW: 14 % (ref 11.5–15.5)
WBC: 6 10*3/uL (ref 4.0–10.5)

## 2016-02-25 LAB — LIPID PANEL
Cholesterol: 134 mg/dL (ref 0–200)
HDL: 44.9 mg/dL (ref >39.00)
LDL Cholesterol: 72 mg/dL (ref 0–99)
NonHDL: 89.36
Total CHOL/HDL Ratio: 3
Triglycerides: 89 mg/dL (ref 0.0–149.0)
VLDL: 17.8 mg/dL (ref 0.0–40.0)

## 2016-02-25 LAB — LIPASE: Lipase: 26 U/L (ref 11.0–59.0)

## 2016-02-28 DIAGNOSIS — M7918 Myalgia, other site: Secondary | ICD-10-CM | POA: Insufficient documentation

## 2016-02-28 DIAGNOSIS — R1010 Upper abdominal pain, unspecified: Secondary | ICD-10-CM | POA: Insufficient documentation

## 2016-02-28 DIAGNOSIS — G4700 Insomnia, unspecified: Secondary | ICD-10-CM | POA: Insufficient documentation

## 2016-02-28 NOTE — Assessment & Plan Note (Signed)
New problem.  Trial of trazodone. 

## 2016-02-28 NOTE — Assessment & Plan Note (Signed)
New problem. Reassuring lab work. Also improving diet. Vomiting resolved. Zofran for nausea as needed.

## 2016-02-28 NOTE — Progress Notes (Signed)
Subjective:  Patient ID: Tammy Gregory, female    DOB: [birth year]  Age: 55 y.o. MRN: [id number]  CC: Insomnia, Neck pain, N/V,Abdominal pain  HPI:  55 year old female presents with several complaints.  Insomnia  Worsening recently.   Taking melatonin without improving.  Has trouble falling and staying asleep.  Neck pain  Patient reporting bilateral trapezius pain for the past 1 month.  No known inciting factor.   No exacerbating or relieving factors.   Moderate in severity.   No other associated symptoms.  Abdominal pain, N/V  Started Monday night.  Grandson with recent illness - nausea, vomiting.  Patient had associated severe abdominal pian.   Vomiting has now resolved.  Still having some nausea.  Abdominal pain mild/moderate. Located in upper abdomen.  Currently eating and increasing diet without difficulty.  No reported fever.   Social Hx   Social History   Social History  . Marital status: Married    Spouse name: N/A  . Number of children: N/A  . Years of education: N/A   Social History Main Topics  . Smoking status: Never Smoker  . Smokeless tobacco: Never Used  . Alcohol use 0.6 - 1.2 oz/week    1 - 2 Standard drinks or equivalent per week  . Drug use: No  . Sexual activity: Yes    Partners: Male   Other Topics Concern  . None   Social History Narrative  . None    Review of Systems  Constitutional: Negative for fever.  Gastrointestinal: Positive for abdominal pain, nausea and vomiting.  Musculoskeletal: Positive for neck pain.  Psychiatric/Behavioral: Positive for sleep disturbance.   Objective:  BP 139/84   Pulse 89   Temp 98.3 F (36.8 C) (Oral)   Resp 14   Wt 194 lb 12 oz (88.3 kg)   LMP 04/02/2007 (Approximate)   SpO2 99%   BMI 34.50 kg/m   BP/Weight 02/24/2016 11/26/2015 [id number]  Systolic BP [id number] 0000000 -  Diastolic BP 84 88 -  Wt. (Lbs) 194.75 197.38 199.5  BMI 34.5 34.96 35.35    Physical Exam    Constitutional: She is oriented to person, place, and time. No distress.  Cardiovascular: Normal rate and regular rhythm.   Pulmonary/Chest: Effort normal and breath sounds normal.  Abdominal: Soft. She exhibits no distension.  Tender in the epigastric region.  Musculoskeletal:  Tenderness of the trapezius muscles bilaterally.   Neurological: She is alert and oriented to person, place, and time.  Psychiatric: She has a normal mood and affect.  Vitals reviewed.   Lab Results  Component Value Date   WBC 6.0 02/24/2016   HGB 14.3 02/24/2016   HCT 42.7 02/24/2016   PLT 171.0 02/24/2016   GLUCOSE 93 02/24/2016   CHOL 134 02/24/2016   TRIG 89.0 02/24/2016   HDL 44.90 02/24/2016   LDLCALC 72 02/24/2016   ALT 16 02/24/2016   AST 20 02/24/2016   NA 141 02/24/2016   K 4.0 02/24/2016   CL 104 02/24/2016   CREATININE 0.85 02/24/2016   BUN 13 02/24/2016   CO2 31 02/24/2016   TSH 1.49 12/28/2014   HGBA1C 5.2 12/28/2014    Assessment & Plan:   Problem List Items Addressed This Visit    Upper abdominal pain    New problem. Reassuring lab work. Also improving diet. Vomiting resolved. Zofran for nausea as needed.       Relevant Orders   CBC (Completed)   Comprehensive metabolic panel (Completed)  Lipase (Completed)   Pure hypercholesterolemia   Relevant Orders   Lipid panel (Completed)   Musculoskeletal pain    New problem. Trap tenderness. MSK in etiology.  Treating with flexeril.       Insomnia - Primary    New problem. Trial of trazodone.         Meds ordered this encounter  Medications  . traZODone (DESYREL) 50 MG tablet    Sig: Take 1 tablet (50 mg total) by mouth at bedtime as needed for sleep.    Dispense:  30 tablet    Refill:  3  . DISCONTD: cyclobenzaprine (FLEXERIL) 10 MG tablet    Sig: Take 1 tablet (10 mg total) by mouth 3 (three) times daily as needed for muscle spasms.    Dispense:  30 tablet    Refill:  0  . ondansetron (ZOFRAN) 4 MG tablet     Sig: Take 1 tablet (4 mg total) by mouth every 8 (eight) hours as needed for nausea or vomiting.    Dispense:  20 tablet    Refill:  0  . cyclobenzaprine (FLEXERIL) 5 MG tablet    Sig: Take 1 tablet (5 mg total) by mouth 3 (three) times daily as needed for muscle spasms.    Dispense:  30 tablet    Refill:  0    Follow-up: PRN  [place]

## 2016-02-28 NOTE — Assessment & Plan Note (Signed)
New problem. Trap tenderness. MSK in etiology.  Treating with flexeril.

## 2016-03-01 ENCOUNTER — Encounter: Payer: Self-pay | Admitting: Emergency Medicine

## 2016-03-01 ENCOUNTER — Emergency Department
Admission: EM | Admit: 2016-03-01 | Discharge: 2016-03-02 | Disposition: A | Discharge status: Home / Self Care | Payer: Federal, State, Local not specified - PPO | Attending: Emergency Medicine | Admitting: Emergency Medicine

## 2016-03-01 DIAGNOSIS — K2100 Gastro-esophageal reflux disease with esophagitis, without bleeding: Secondary | ICD-10-CM

## 2016-03-01 DIAGNOSIS — I10 Essential (primary) hypertension: Secondary | ICD-10-CM | POA: Diagnosis not present

## 2016-03-01 DIAGNOSIS — R1013 Epigastric pain: Secondary | ICD-10-CM | POA: Diagnosis not present

## 2016-03-01 DIAGNOSIS — R1011 Right upper quadrant pain: Secondary | ICD-10-CM | POA: Diagnosis not present

## 2016-03-01 LAB — COMPREHENSIVE METABOLIC PANEL
ALT: 39 U/L (ref 14–54)
AST: 34 U/L (ref 15–41)
Albumin: 4 g/dL (ref 3.5–5.0)
Alkaline Phosphatase: 80 U/L (ref 38–126)
Anion gap: 4 — ABNORMAL LOW (ref 5–15)
BUN: 15 mg/dL (ref 6–20)
CO2: 28 mmol/L (ref 22–32)
Calcium: 9 mg/dL (ref 8.9–10.3)
Chloride: 109 mmol/L (ref 101–111)
Creatinine, Ser: 1.05 mg/dL — ABNORMAL HIGH (ref 0.44–1.00)
GFR calc Af Amer: >60 mL/min (ref >60)
GFR calc non Af Amer: 59 mL/min — ABNORMAL LOW (ref >60)
Glucose, Bld: 96 mg/dL (ref 65–99)
Potassium: 4.3 mmol/L (ref 3.5–5.1)
Sodium: 141 mmol/L (ref 135–145)
Total Bilirubin: 0.4 mg/dL (ref 0.3–1.2)
Total Protein: 7.5 g/dL (ref 6.5–8.1)

## 2016-03-01 LAB — CBC
HCT: 41.3 % (ref 35.0–47.0)
Hemoglobin: 13.8 g/dL (ref 12.0–16.0)
MCH: 29.1 pg (ref 26.0–34.0)
MCHC: 33.4 g/dL (ref 32.0–36.0)
MCV: 87.1 fL (ref 80.0–100.0)
Platelets: 220 10*3/uL (ref 150–440)
RBC: 4.74 MIL/uL (ref 3.80–5.20)
RDW: 13.5 % (ref 11.5–14.5)
WBC: 6.1 10*3/uL (ref 3.6–11.0)

## 2016-03-01 LAB — URINALYSIS, COMPLETE (UACMP) WITH MICROSCOPIC
Bacteria, UA: NONE SEEN
Bilirubin Urine: NEGATIVE
Glucose, UA: NEGATIVE mg/dL
Ketones, ur: NEGATIVE mg/dL
Nitrite: NEGATIVE
Protein, ur: NEGATIVE mg/dL
Specific Gravity, Urine: 1.018 (ref 1.005–1.030)
pH: 5 (ref 5.0–8.0)

## 2016-03-01 LAB — LIPASE, BLOOD: Lipase: 32 U/L (ref 11–51)

## 2016-03-01 MED ORDER — SODIUM CHLORIDE 0.9 % IV BOLUS (SEPSIS)
1000.0000 mL | Freq: Once | INTRAVENOUS | Status: AC
  Administered 2016-03-02: 1000 mL via INTRAVENOUS

## 2016-03-01 MED ORDER — MORPHINE SULFATE (PF) 2 MG/ML IV SOLN
2.0000 mg | Freq: Once | INTRAVENOUS | Status: AC
  Administered 2016-03-02: 2 mg via INTRAVENOUS
  Filled 2016-03-01: qty 1

## 2016-03-01 MED ORDER — ONDANSETRON HCL 4 MG/2ML IJ SOLN
4.0000 mg | Freq: Once | INTRAMUSCULAR | Status: AC
  Administered 2016-03-02: 4 mg via INTRAVENOUS
  Filled 2016-03-01: qty 2

## 2016-03-01 MED ORDER — FAMOTIDINE IN NACL 20-0.9 MG/50ML-% IV SOLN
20.0000 mg | Freq: Once | INTRAVENOUS | Status: AC
  Administered 2016-03-02: 20 mg via INTRAVENOUS
  Filled 2016-03-01: qty 50

## 2016-03-01 NOTE — ED Provider Notes (Signed)
[facility] Emergency Department Provider Note   ____________________________________________   First MD Initiated Contact with Patient 03/01/16 2313     (approximate)  I have reviewed the triage vital signs and the nursing notes.   HISTORY  Chief Complaint Abdominal Pain; Nausea; and GI Bleeding    HPI Tammy Gregory is a 55 y.o. female who presents to the ED from home with a chief complaint of abdominal pain, nausea/vomiting, diarrhea. Patient reports epigastric and right upper quadrant burning type discomfort times one week. Seen by her PCP and prescribed Zofran. Told to come to the ED for evaluation if she continued to experience pain and/or vomiting. Tonight patient ate and shortly thereafter began to have nausea and vomiting 2. Second episode had streaks of bright red blood. Patient denies use of anticoagulants. Also reports a 2 day history of diarrhea. States she had similar symptoms 1 week prior to Christmas. Denies associated fever, chills, chest pain, shortness of breath, dysuria. Denies recent travel or trauma. Nothing makes her symptoms better or worse.   Past Medical History:  Diagnosis Date  . Chicken pox   . Headache   . Hypertension     Patient Active Problem List   Diagnosis Date Noted  . Upper abdominal pain 02/28/2016  . Insomnia 02/28/2016  . Musculoskeletal pain 02/28/2016  . Pure hypercholesterolemia 02/24/2016  . Mild depression ([place]) 07/23/2015  . Weight gain 07/23/2015  . Obesity (BMI 35.0-39.9 without comorbidity) 12/28/2014  . History of hypertension 12/28/2014  . S/P cervical spinal fusion 12/28/2014  . Preventative health care 12/28/2014  . Family history of breast cancer 10/11/2011    Past Surgical History:  Procedure Laterality Date  . CERVICAL FUSION  12/03/2014  . CESAREAN SECTION     3  . COLONOSCOPY WITH PROPOFOL N/A 04/02/2015   Procedure: COLONOSCOPY WITH PROPOFOL;  Surgeon: Lollie Sails, MD;   Location: [facility] ENDOSCOPY;  Service: Endoscopy;  Laterality: N/A;    Prior to Admission medications   Medication Sig Start Date End Date Taking? Authorizing Provider  cyclobenzaprine (FLEXERIL) 5 MG tablet Take 1 tablet (5 mg total) by mouth 3 (three) times daily as needed for muscle spasms. 02/24/16   Coral Spikes, DO  ibuprofen (ADVIL,MOTRIN) 400 MG tablet Take 400 mg by mouth every 6 (six) hours as needed.    Historical Provider, MD  ondansetron (ZOFRAN) 4 MG tablet Take 1 tablet (4 mg total) by mouth every 8 (eight) hours as needed for nausea or vomiting. 02/24/16   Coral Spikes, DO  traZODone (DESYREL) 50 MG tablet Take 1 tablet (50 mg total) by mouth at bedtime as needed for sleep. 02/24/16   Coral Spikes, DO    Allergies Codeine; Amoxicillin; and Clindamycin  Family History  Problem Relation Age of Onset  . Hypertension Father     Social History Social History  Substance Use Topics  . Smoking status: Never Smoker  . Smokeless tobacco: Never Used  . Alcohol use Yes     Comment: occ    Review of Systems  Constitutional: No fever/chills. Eyes: No visual changes. ENT: No sore throat. Cardiovascular: Denies chest pain. Respiratory: Denies shortness of breath. Gastrointestinal: Positive for abdominal pain, nausea and vomiting.  Positive for diarrhea.  No constipation. Genitourinary: Negative for dysuria. Musculoskeletal: Negative for back pain. Skin: Negative for rash. Neurological: Negative for headaches, focal weakness or numbness.  10-point ROS otherwise negative.  ____________________________________________   PHYSICAL EXAM:  VITAL SIGNS: ED Triage  Vitals  Enc Vitals Group     BP 03/01/16 2054 (!) 157/89     Pulse Rate 03/01/16 2054 86     Resp 03/01/16 2054 18     Temp 03/01/16 2101 97.5 F (36.4 C)     Temp Source 03/01/16 2101 Oral     SpO2 03/01/16 2054 100 %     Weight 03/01/16 2056 194 lb (88 kg)     Height 03/01/16 2056 5\' 2"  (1.575 m)     Head  Circumference --      Peak Flow --      Pain Score 03/01/16 2056 5     Pain Loc --      Pain Edu? --      Excl. in [place]? --     Constitutional: Alert and oriented. Well appearing and in no acute distress. Eyes: Conjunctivae are normal. PERRL. EOMI. Head: Atraumatic. Nose: No congestion/rhinnorhea. Mouth/Throat: Mucous membranes are moist.  Oropharynx non-erythematous. Neck: No stridor.   Cardiovascular: Normal rate, regular rhythm. Grossly normal heart sounds.  Good peripheral circulation. Respiratory: Normal respiratory effort.  No retractions. Lungs CTAB. Gastrointestinal: Soft and mildly tender to palpation epigastrium and right upper quadrant without rebound or guarding. No distention. No abdominal bruits. No CVA tenderness. Musculoskeletal: No lower extremity tenderness nor edema.  No joint effusions. Neurologic:  Normal speech and language. No gross focal neurologic deficits are appreciated. No gait instability. Skin:  Skin is warm, dry and intact. No rash noted. Psychiatric: Mood and affect are normal. Speech and behavior are normal.  ____________________________________________   LABS (all labs ordered are listed, but only abnormal results are displayed)  Labs Reviewed  COMPREHENSIVE METABOLIC PANEL - Abnormal; Notable for the following:       Result Value   Creatinine, Ser 1.05 (*)    GFR calc non Af Amer 59 (*)    Anion gap 4 (*)    All other components within normal limits  URINALYSIS, COMPLETE (UACMP) WITH MICROSCOPIC - Abnormal; Notable for the following:    Color, Urine YELLOW (*)    APPearance CLEAR (*)    Hgb urine dipstick SMALL (*)    Leukocytes, UA TRACE (*)    Squamous Epithelial / LPF 0-5 (*)    All other components within normal limits  LIPASE, BLOOD  CBC   ____________________________________________  EKG  ED ECG REPORT I, SUNG,JADE J, the attending physician, personally viewed and interpreted this ECG.   Date: 03/02/2016  EKG Time: 0044   Rate: 71  Rhythm: normal EKG, normal sinus rhythm  Axis: Normal  Intervals:none  ST&T Change: Nonspecific  ____________________________________________  RADIOLOGY  Ultrasound interpreted per Dr. Radene Knee: 1. No acute abnormality seen at the right upper quadrant.  2. Mild fatty infiltration within the liver.   CT abdomen and pelvis interpreted per Dr. Marisue Humble: No acute abnormality or explanation for abdominal pain and vomiting. ____________________________________________   PROCEDURES  Procedure(s) performed: None  Procedures  Critical Care performed: No  ____________________________________________   INITIAL IMPRESSION / ASSESSMENT AND PLAN / ED COURSE  Pertinent labs & imaging results that were available during my care of the patient were reviewed by me and considered in my medical decision making (see chart for details).  55 year old female who presents with epigastric and right upper quadrant burning suggestive of PUD. Initial laboratory and urinalysis results are unremarkable. Will initiate IV fluid resuscitation, Pepcid and proceed with limited right upper quadrant abdominal ultrasound to evaluate for cholecystitis.  Clinical Course as of Mar 03 403  Thu Mar 02, 2016  0055 Updated patient and spouse of negative ultrasound result. Will proceed to CT abdomen/pelvis. She is resting comfortably in no acute distress.  [JS]  [id number] Updated patient and spouse of negative CT result. Will place patient on medicines for gastritis and she will follow-up with her PCP next week. Strict return precautions given. Patient and spouse verbalized understanding and agree with plan of care.  [JS]    Clinical Course User Index [JS] Paulette Blanch, MD     ____________________________________________   FINAL CLINICAL IMPRESSION(S) / ED DIAGNOSES  Final diagnoses:  Epigastric pain  Gastroesophageal reflux disease with esophagitis      NEW MEDICATIONS STARTED DURING THIS VISIT:  New  Prescriptions   No medications on file     Note:  This document was prepared using Dragon voice recognition software and may include unintentional dictation errors.    Paulette Blanch, MD 03/02/16 [phone]

## 2016-03-01 NOTE — ED Triage Notes (Addendum)
Patient reports right lower abdominal pain, nausea and diarrhea that started about a week ago. Patient reports one episode of vomiting bright red blood.

## 2016-03-02 ENCOUNTER — Emergency Department: Payer: Federal, State, Local not specified - PPO

## 2016-03-02 DIAGNOSIS — K210 Gastro-esophageal reflux disease with esophagitis: Secondary | ICD-10-CM | POA: Diagnosis not present

## 2016-03-02 DIAGNOSIS — R1013 Epigastric pain: Secondary | ICD-10-CM | POA: Diagnosis not present

## 2016-03-02 DIAGNOSIS — R1011 Right upper quadrant pain: Secondary | ICD-10-CM | POA: Diagnosis not present

## 2016-03-02 MED ORDER — IOPAMIDOL (ISOVUE-300) INJECTION 61%
100.0000 mL | Freq: Once | INTRAVENOUS | Status: AC | PRN
  Administered 2016-03-02: 100 mL via INTRAVENOUS

## 2016-03-02 MED ORDER — PANTOPRAZOLE SODIUM 40 MG PO TBEC: 40.0000 mg | DELAYED_RELEASE_TABLET | Freq: Every day | ORAL | 0 refills | Status: DC

## 2016-03-02 MED ORDER — SUCRALFATE 1 GM/10ML PO SUSP: 1.0000 g | Freq: Four times a day (QID) | ORAL | 1 refills | Status: DC

## 2016-03-02 MED ORDER — IOPAMIDOL (ISOVUE-300) INJECTION 61%
30.0000 mL | Freq: Once | INTRAVENOUS | Status: AC | PRN
  Administered 2016-03-02: 30 mL via ORAL

## 2016-03-02 NOTE — Discharge Instructions (Signed)
1. Start Protonix 40 mg daily. 2. Start Carafate 4 times daily with meals and nightly. 3. Continue Zofran as needed for nausea/vomiting. 4. Return to the ER for worsening symptoms, persistent vomiting, difficulty breathing or other concerns.

## 2016-03-24 ENCOUNTER — Other Ambulatory Visit: Payer: Self-pay | Admitting: Family Medicine

## 2016-03-24 NOTE — Telephone Encounter (Signed)
Pt last refill of flexeril was on 02/24/16. Pt last OV and last labs were on 02/24/16. PT has no future scheduled appts at this time. Ok to refill?

## 2016-04-07 DIAGNOSIS — H43393 Other vitreous opacities, bilateral: Secondary | ICD-10-CM | POA: Diagnosis not present

## 2016-04-07 DIAGNOSIS — H524 Presbyopia: Secondary | ICD-10-CM | POA: Diagnosis not present

## 2016-04-07 DIAGNOSIS — H52203 Unspecified astigmatism, bilateral: Secondary | ICD-10-CM | POA: Diagnosis not present

## 2016-04-07 DIAGNOSIS — H5203 Hypermetropia, bilateral: Secondary | ICD-10-CM | POA: Diagnosis not present

## 2016-06-06 DIAGNOSIS — M1712 Unilateral primary osteoarthritis, left knee: Secondary | ICD-10-CM | POA: Diagnosis not present

## 2016-06-06 DIAGNOSIS — G8929 Other chronic pain: Secondary | ICD-10-CM | POA: Diagnosis not present

## 2016-06-06 DIAGNOSIS — M25562 Pain in left knee: Secondary | ICD-10-CM | POA: Diagnosis not present

## 2016-06-06 DIAGNOSIS — M25561 Pain in right knee: Secondary | ICD-10-CM | POA: Diagnosis not present

## 2016-06-06 DIAGNOSIS — M170 Bilateral primary osteoarthritis of knee: Secondary | ICD-10-CM | POA: Diagnosis not present

## 2016-06-15 DIAGNOSIS — G8929 Other chronic pain: Secondary | ICD-10-CM | POA: Diagnosis not present

## 2016-06-15 DIAGNOSIS — M722 Plantar fascial fibromatosis: Secondary | ICD-10-CM | POA: Diagnosis not present

## 2016-06-15 DIAGNOSIS — M216X2 Other acquired deformities of left foot: Secondary | ICD-10-CM | POA: Diagnosis not present

## 2016-06-15 DIAGNOSIS — M79672 Pain in left foot: Secondary | ICD-10-CM | POA: Diagnosis not present

## 2016-08-01 DIAGNOSIS — Z01419 Encounter for gynecological examination (general) (routine) without abnormal findings: Secondary | ICD-10-CM | POA: Diagnosis not present

## 2016-08-01 DIAGNOSIS — Z113 Encounter for screening for infections with a predominantly sexual mode of transmission: Secondary | ICD-10-CM | POA: Diagnosis not present

## 2016-08-23 ENCOUNTER — Encounter: Payer: Self-pay | Admitting: Family Medicine

## 2016-08-23 ENCOUNTER — Ambulatory Visit (INDEPENDENT_AMBULATORY_CARE_PROVIDER_SITE_OTHER): Payer: Federal, State, Local not specified - PPO | Admitting: Family Medicine

## 2016-08-23 DIAGNOSIS — H9201 Otalgia, right ear: Secondary | ICD-10-CM

## 2016-08-23 MED ORDER — FLUTICASONE PROPIONATE 50 MCG/ACT NA SUSP: 2.0000 | Freq: Every day | NASAL | 6 refills | Status: DC

## 2016-08-23 MED ORDER — LORATADINE 10 MG PO TABS: 10.0000 mg | ORAL_TABLET | Freq: Every day | ORAL | 11 refills | Status: DC

## 2016-08-23 NOTE — Patient Instructions (Signed)
Nice to see you. I believe you're symptoms are likely related to TMJ and/or eustachian tube dysfunction. You should continue over-the-counter ibuprofen. We'll treat you with Flonase and Claritin as well to help decrease inflammation. If this does not help over the next several days please follow up so you we can evaluate you prior to you going on your trip to [place]. If you develop fevers or worsening symptoms please be reevaluated.

## 2016-08-23 NOTE — Assessment & Plan Note (Signed)
Suspect related to TMJ and/or eustachian tube dysfunction. She has a relatively benign exam of her ear. Discussed continuing ibuprofen. We'll add Claritin and Flonase. If not improving she'll follow-up before her trip to [place]. If worsens she'll follow-up as well.

## 2016-08-23 NOTE — Progress Notes (Signed)
  Tommi Rumps, MD Phone: [phone]  TERR[facility] Tammy Gregory is a 56 y.o. female who presents today for same-day visit.  Patient notes right ear pain for the last 2 days. Notes no upper respiratory congestion. Does note some postnasal drip. Has some sore throat when trying to swallow. Notes her head is sore anterior to her ear and posterior to her ear. Some discomfort when she opens her jaw. She's had no fevers. No chills. No sweats. No other upper respiratory symptoms. Has had this happen in the past. She's been taking ibuprofen.  ROS see history of present illness  Objective  Physical Exam Vitals:   08/23/16 1030  BP: 126/80  Pulse: 72  Temp: 98 F (36.7 C)    BP Readings from Last 3 Encounters:  08/23/16 126/80  03/02/16 131/82  02/24/16 139/84   Wt Readings from Last 3 Encounters:  08/23/16 197 lb 6.4 oz (89.5 kg)  03/01/16 194 lb (88 kg)  02/24/16 194 lb 12 oz (88.3 kg)    Physical Exam  Constitutional: No distress.  HENT:  Head: Normocephalic and atraumatic.  Mouth/Throat: Oropharynx is clear and moist. No oropharyngeal exudate.  Normal TMs bilaterally, clicking of the right TMJ with mild tenderness, mild soreness posterior to her right ear though no swelling or erythema, no lymphadenopathy in the preauricular, or posterior auricular or occipital area  Eyes: Conjunctivae are normal. Pupils are equal, round, and reactive to light.  Neck: Neck supple.  Cardiovascular: Normal rate, regular rhythm and normal heart sounds.   Pulmonary/Chest: Effort normal and breath sounds normal.  Lymphadenopathy:    She has no cervical adenopathy.  Skin: She is not diaphoretic.     Assessment/Plan: Please see individual problem list.  Ear pain, right Suspect related to TMJ and/or eustachian tube dysfunction. She has a relatively benign exam of her ear. Discussed continuing ibuprofen. We'll add Claritin and Flonase. If not improving she'll follow-up before her trip to [place] If worsens she'll follow-up as well.   No orders of the defined types were placed in this encounter.   Meds ordered this encounter  Medications  . loratadine (CLARITIN) 10 MG tablet    Sig: Take 1 tablet (10 mg total) by mouth daily.    Dispense:  30 tablet    Refill:  11  . fluticasone (FLONASE) 50 MCG/ACT nasal spray    Sig: Place 2 sprays into both nostrils daily.    Dispense:  16 g    Refill:  [place], MD [place]

## 2016-10-09 ENCOUNTER — Ambulatory Visit (INDEPENDENT_AMBULATORY_CARE_PROVIDER_SITE_OTHER): Payer: Federal, State, Local not specified - PPO | Admitting: Family Medicine

## 2016-10-09 ENCOUNTER — Ambulatory Visit (INDEPENDENT_AMBULATORY_CARE_PROVIDER_SITE_OTHER): Payer: Federal, State, Local not specified - PPO

## 2016-10-09 ENCOUNTER — Encounter: Payer: Self-pay | Admitting: Family Medicine

## 2016-10-09 VITALS — BP 128/90 | HR 65 | Temp 98.5°F | Resp 12 | Wt 201.5 lb

## 2016-10-09 DIAGNOSIS — M5442 Lumbago with sciatica, left side: Secondary | ICD-10-CM | POA: Diagnosis not present

## 2016-10-09 DIAGNOSIS — M5441 Lumbago with sciatica, right side: Secondary | ICD-10-CM

## 2016-10-09 DIAGNOSIS — M47816 Spondylosis without myelopathy or radiculopathy, lumbar region: Secondary | ICD-10-CM | POA: Diagnosis not present

## 2016-10-09 MED ORDER — PREDNISONE 10 MG (21) PO TBPK: ORAL_TABLET | ORAL | 0 refills | Status: DC

## 2016-10-09 NOTE — Patient Instructions (Signed)
Prednisone as prescribed.  Flexeril 10 mg at night.  Call if persists.  Take care  Dr. Lacinda Axon

## 2016-10-10 DIAGNOSIS — M5442 Lumbago with sciatica, left side: Principal | ICD-10-CM

## 2016-10-10 DIAGNOSIS — M5441 Lumbago with sciatica, right side: Secondary | ICD-10-CM | POA: Insufficient documentation

## 2016-10-10 NOTE — Progress Notes (Signed)
Subjective:  Patient ID: Tammy Gregory, female    DOB: 1960-07-14  Age: 56 y.o. MRN: [id number]  CC: Back pain  HPI:  56 year old female presents with back pain.  Started last week. Located in the low back. She reports bilateral leg numbness, left greater than right. No cyanoses. No incontinence. Pain has been slowly worsening. Currently severe. Interfering with range of motion. Worsened by range of motion. No known relieving factors. She's tried ibuprofen with no improvement. She states that it started after a Zumba class. She has been working out recently. No other complaints or concerns at this time.  Social Hx   Social History   Social History  . Marital status: Married    Spouse name: N/A  . Number of children: N/A  . Years of education: N/A   Social History Main Topics  . Smoking status: Never Smoker  . Smokeless tobacco: Never Used  . Alcohol use Yes     Comment: occ  . Drug use: No  . Sexual activity: Not Asked   Other Topics Concern  . None   Social History Narrative  . None    Review of Systems  Constitutional: Negative.   Musculoskeletal: Positive for back pain.  Neurological: Positive for numbness.   Objective:  BP 128/90 (BP Location: Left Arm, Patient Position: Sitting, Cuff Size: Normal)   Pulse 65   Temp 98.5 F (36.9 C) (Oral)   Resp 12   Wt 201 lb 8 oz (91.4 kg)   LMP 04/02/2007 (Approximate)   BMI 36.85 kg/m   BP/Weight 10/09/2016 08/23/2016 81/44/8185  Systolic BP 631 497 026  Diastolic BP 90 80 82  Wt. (Lbs) 201.5 197.4 -  BMI 36.85 36.1 -   Physical Exam  Constitutional: She is oriented to person, place, and time. She appears well-developed. No distress.  Cardiovascular: Normal rate and regular rhythm.   No murmur heard. Pulmonary/Chest: Effort normal and breath sounds normal. She has no wheezes. She has no rales.  Musculoskeletal:  Low back - there is prominent musculature tender to palpation. Negative straight leg raise.  Decreased range of motion in all planes secondary to pain.  Neurological: She is alert and oriented to person, place, and time.  Psychiatric: She has a normal mood and affect.  Vitals reviewed.   Lab Results  Component Value Date   WBC 6.1 03/01/2016   HGB 13.8 03/01/2016   HCT 41.3 03/01/2016   PLT 220 03/01/2016   GLUCOSE 96 03/01/2016   CHOL 134 02/24/2016   TRIG 89.0 02/24/2016   HDL 44.90 02/24/2016   LDLCALC 72 02/24/2016   ALT 39 03/01/2016   AST 34 03/01/2016   NA 141 03/01/2016   K 4.3 03/01/2016   CL 109 03/01/2016   CREATININE 1.05 (H) 03/01/2016   BUN 15 03/01/2016   CO2 28 03/01/2016   TSH 1.49 12/28/2014   HGBA1C 5.2 12/28/2014    Assessment & Plan:   Problem List Items Addressed This Visit    Acute bilateral low back pain with bilateral sciatica - Primary    New problem. Given the worsening, obtaining x-ray for further evaluation. Treating with prednisone. Patient to increase her Flexeril from 5-10 mg. If persist, will need further imaging.      Relevant Medications   predniSONE (STERAPRED UNI-PAK 21 TAB) 10 MG (21) TBPK tablet   Other Relevant Orders   DG Lumbar Spine Complete (Completed)      Meds ordered this encounter  Medications  .  predniSONE (STERAPRED UNI-PAK 21 TAB) 10 MG (21) TBPK tablet    Sig: Per package instructions. 6 tablets on day 1, then decrease by 1 tablet daily until gone.    Dispense:  21 tablet    Refill:  0   Follow-up: PRN  [place]

## 2016-10-10 NOTE — Assessment & Plan Note (Signed)
New problem. Given the worsening, obtaining x-ray for further evaluation. Treating with prednisone. Patient to increase her Flexeril from 5-10 mg. If persist, will need further imaging.

## 2016-10-12 ENCOUNTER — Other Ambulatory Visit: Payer: Self-pay | Admitting: Family Medicine

## 2016-10-12 ENCOUNTER — Telehealth: Payer: Self-pay | Admitting: *Deleted

## 2016-10-12 ENCOUNTER — Telehealth: Payer: Self-pay

## 2016-10-12 DIAGNOSIS — M5442 Lumbago with sciatica, left side: Principal | ICD-10-CM

## 2016-10-12 DIAGNOSIS — M5441 Lumbago with sciatica, right side: Secondary | ICD-10-CM

## 2016-10-12 NOTE — Telephone Encounter (Signed)
Patient calls on third day of Prednisone 10mg  , and flexeril isn't  working.   Patient states feels like she not getting better.  Pain has moved to right side of back.  In the morning she feels like she is  able to walk however once she starts walking back starts getting tight and  legs get numb.   She thinks she needs to go ahead with MRI.  Please call [phone].

## 2016-10-12 NOTE — Telephone Encounter (Signed)
Patient advised of below and verbalized understanding.  

## 2016-10-12 NOTE — Telephone Encounter (Signed)
Placing order for MRI.

## 2016-10-13 ENCOUNTER — Other Ambulatory Visit: Payer: Self-pay | Admitting: Family Medicine

## 2016-10-13 MED ORDER — TRAMADOL HCL 50 MG PO TABS: 50.0000 mg | ORAL_TABLET | Freq: Three times a day (TID) | ORAL | 0 refills | Status: DC | PRN

## 2016-10-13 NOTE — Telephone Encounter (Signed)
Pt would like pain medication to be sent to [facility] [address]  Please call when sent. H: [phone]/ H:[phone] She states that she will have to leave her house at noon to pick up her grandkids.

## 2016-10-17 ENCOUNTER — Ambulatory Visit
Admission: RE | Admit: 2016-10-17 | Discharge: 2016-10-17 | Disposition: A | Discharge status: Home / Self Care | Payer: Federal, State, Local not specified - PPO | Source: Ambulatory Visit | Attending: Family Medicine | Admitting: Family Medicine

## 2016-10-17 DIAGNOSIS — M5126 Other intervertebral disc displacement, lumbar region: Secondary | ICD-10-CM | POA: Insufficient documentation

## 2016-10-17 DIAGNOSIS — M5441 Lumbago with sciatica, right side: Secondary | ICD-10-CM | POA: Diagnosis not present

## 2016-10-17 DIAGNOSIS — M545 Low back pain: Secondary | ICD-10-CM | POA: Diagnosis not present

## 2016-10-17 DIAGNOSIS — M47896 Other spondylosis, lumbar region: Secondary | ICD-10-CM | POA: Insufficient documentation

## 2016-10-17 DIAGNOSIS — M5442 Lumbago with sciatica, left side: Secondary | ICD-10-CM | POA: Diagnosis not present

## 2016-10-17 DIAGNOSIS — M48061 Spinal stenosis, lumbar region without neurogenic claudication: Secondary | ICD-10-CM | POA: Diagnosis not present

## 2016-10-17 DIAGNOSIS — M4807 Spinal stenosis, lumbosacral region: Secondary | ICD-10-CM | POA: Diagnosis not present

## 2016-10-18 ENCOUNTER — Other Ambulatory Visit: Payer: Self-pay | Admitting: Family Medicine

## 2016-10-18 DIAGNOSIS — M5126 Other intervertebral disc displacement, lumbar region: Secondary | ICD-10-CM

## 2016-10-18 DIAGNOSIS — M4726 Other spondylosis with radiculopathy, lumbar region: Secondary | ICD-10-CM

## 2016-10-18 MED ORDER — GABAPENTIN 300 MG PO CAPS: 300.0000 mg | ORAL_CAPSULE | Freq: Every day | ORAL | 3 refills | Status: DC

## 2016-10-25 DIAGNOSIS — H02834 Dermatochalasis of left upper eyelid: Secondary | ICD-10-CM | POA: Diagnosis not present

## 2016-10-25 DIAGNOSIS — H02831 Dermatochalasis of right upper eyelid: Secondary | ICD-10-CM | POA: Diagnosis not present

## 2016-10-25 DIAGNOSIS — H02835 Dermatochalasis of left lower eyelid: Secondary | ICD-10-CM | POA: Diagnosis not present

## 2016-10-25 DIAGNOSIS — L908 Other atrophic disorders of skin: Secondary | ICD-10-CM | POA: Diagnosis not present

## 2016-10-25 DIAGNOSIS — H02832 Dermatochalasis of right lower eyelid: Secondary | ICD-10-CM | POA: Diagnosis not present

## 2016-10-27 DIAGNOSIS — M48062 Spinal stenosis, lumbar region with neurogenic claudication: Secondary | ICD-10-CM | POA: Diagnosis not present

## 2016-12-15 DIAGNOSIS — Z1231 Encounter for screening mammogram for malignant neoplasm of breast: Secondary | ICD-10-CM | POA: Diagnosis not present

## 2017-01-23 IMAGING — CT CT ABD-PELV W/ CM
2 of 5 series · 16 of 46 positions shown, 18 images · IV contrast (APPLIED)
Comparison: Right upper quadrant ultrasound earlier this day.

CLINICAL DATA: Epigastric abdominal pain.  Nausea and vomiting.

EXAM:
CT ABDOMEN AND PELVIS WITH CONTRAST
TECHNIQUE: Multidetector CT imaging of the abdomen and pelvis was performed
using the standard protocol following bolus administration of
intravenous contrast.
CONTRAST:  100mL [id number]-RHH IOPAMIDOL (SKKZ3U-RHH) INJECTION 61%

[Series 2: routine abd/pel with · axial · 0.97mm/px · z∈[-984,-574]mm · 13 of 94 slices shown, 15 images]
[im 6/94  soft-tissue]
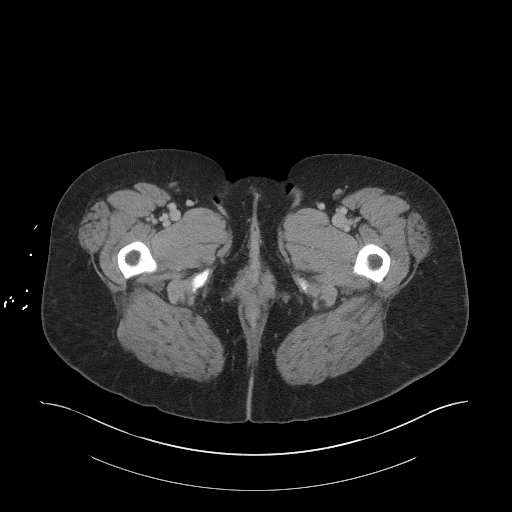
[im 6/94  bone]
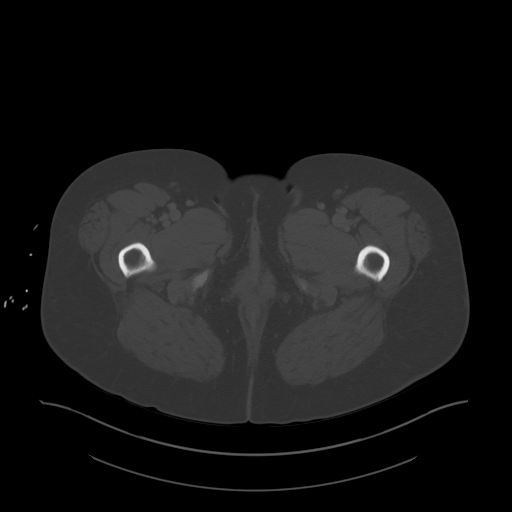
[im 11/94  soft-tissue]
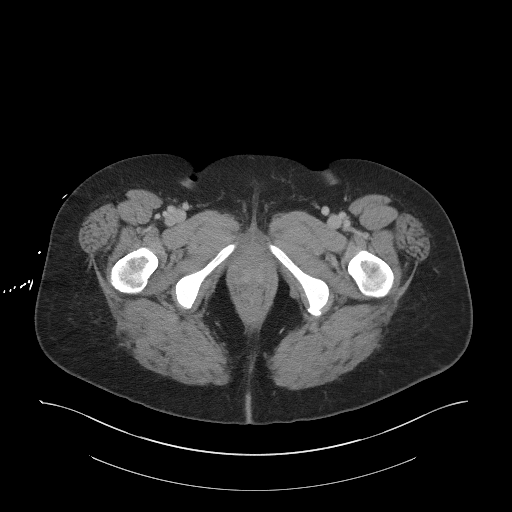
[im 21/94  soft-tissue]
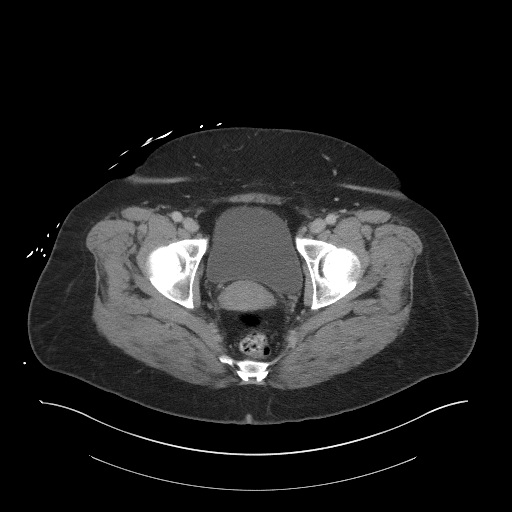
[im 26/94  soft-tissue]
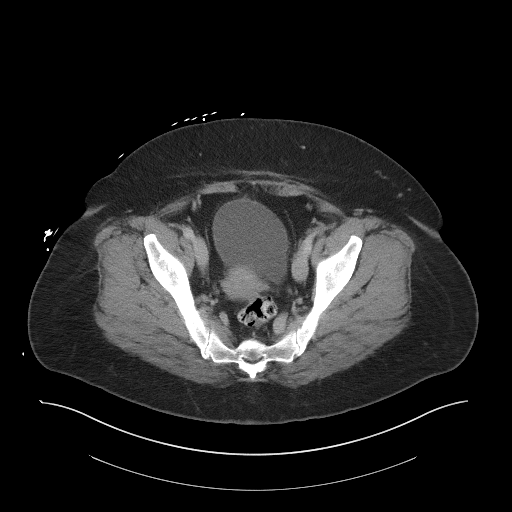
[im 32/94  soft-tissue]
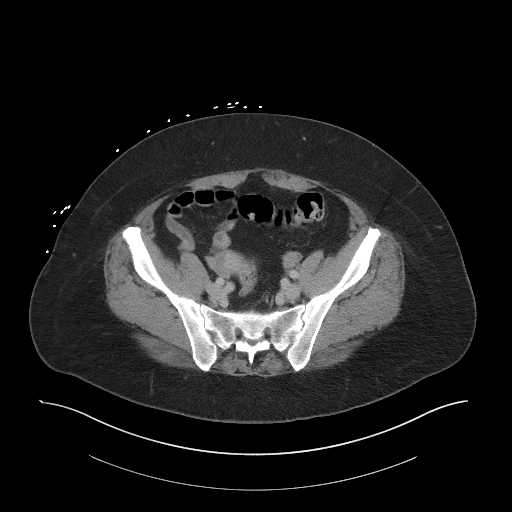
[im 42/94  soft-tissue]
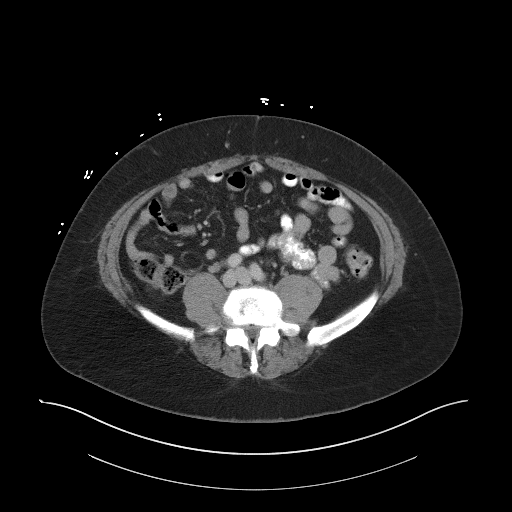
[im 47/94  soft-tissue]
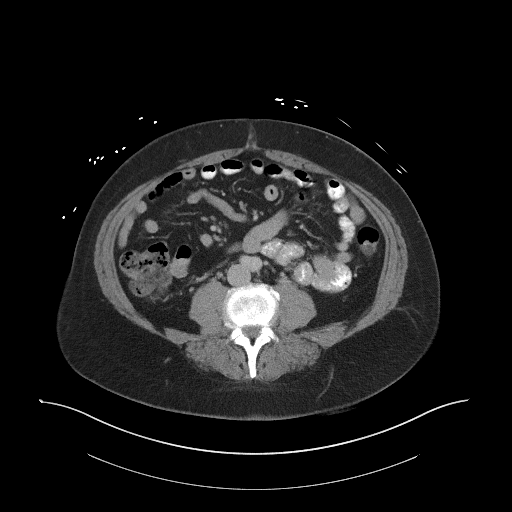
[im 52/94  soft-tissue]
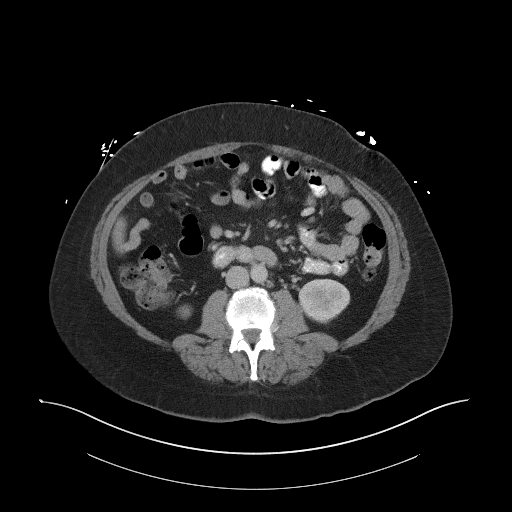
[im 63/94  soft-tissue]
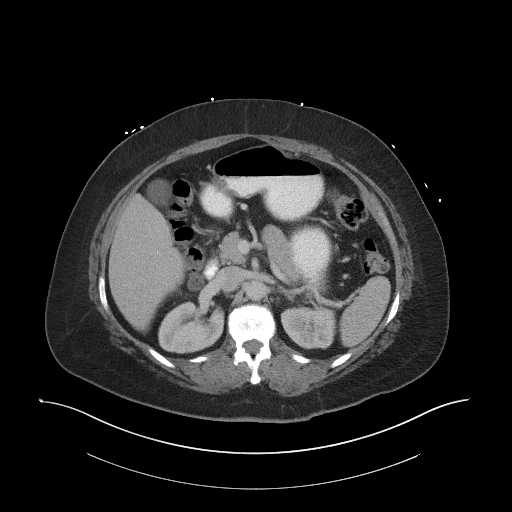
[im 63/94  bone]
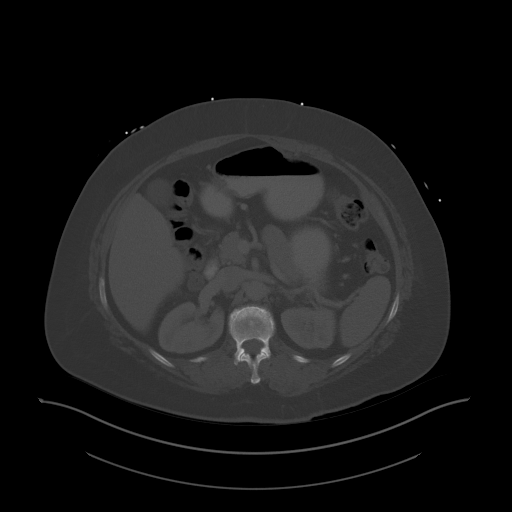
[im 68/94  soft-tissue]
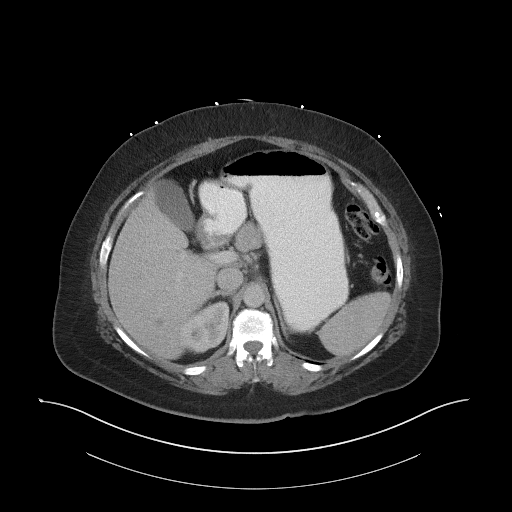
[im 73/94  soft-tissue]
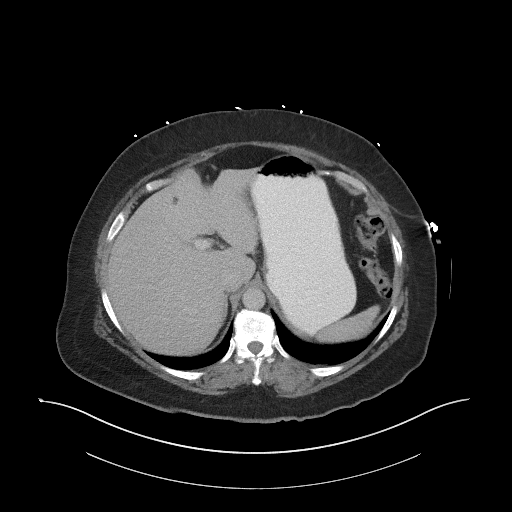
[im 83/94  soft-tissue]
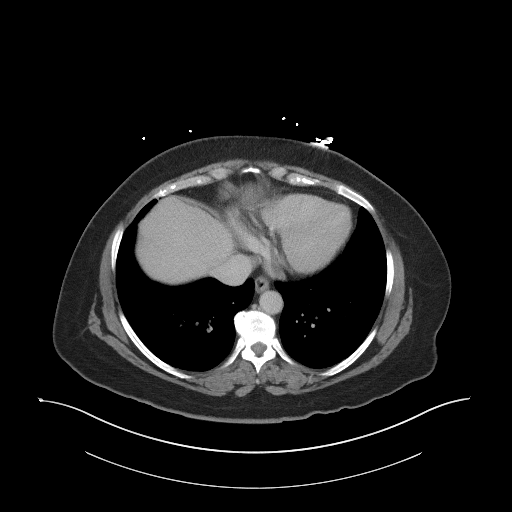
[im 88/94  soft-tissue]
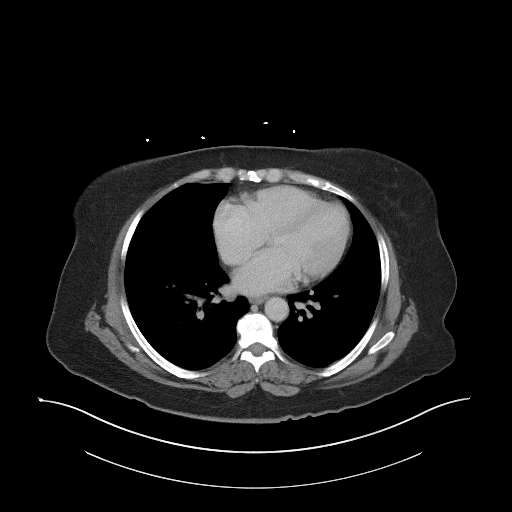

[Series 5: coronal st · coronal · 0.81mm/px · 3 of 101 slices shown]
[im 34/101  soft-tissue]
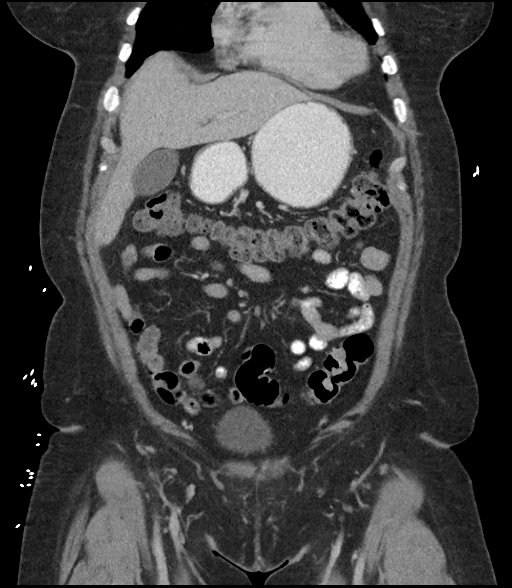
[im 45/101  soft-tissue]
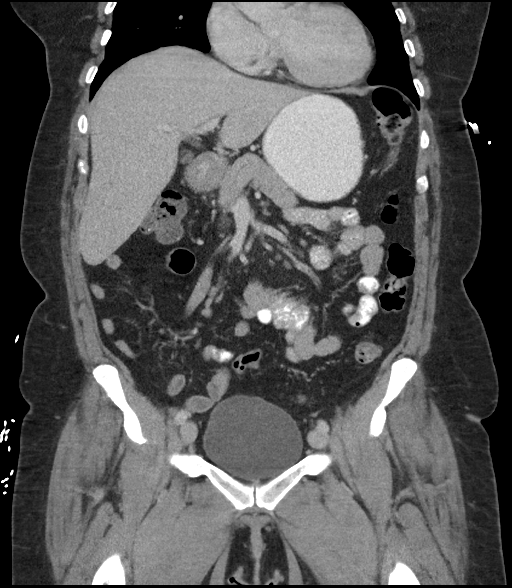
[im 56/101  soft-tissue]
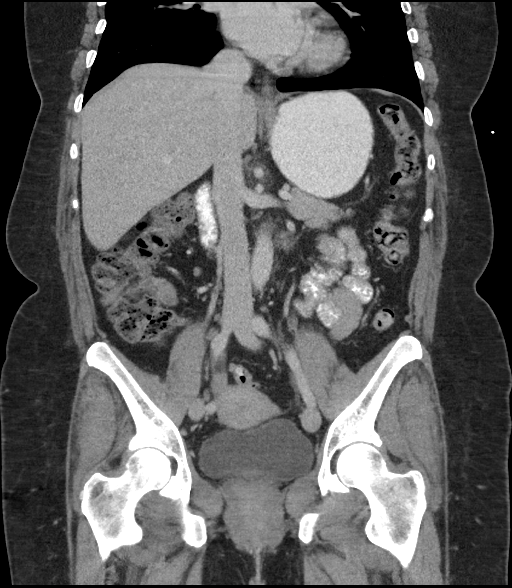

[16 of 46 positions shown; findings below may reference images not displayed]

FINDINGS: Lower chest: No acute abnormality.

Hepatobiliary: Tiny subcentimeter hypodensity in the right lobe of
the liver, too small to accurately characterize. There is a 9 mm
cyst in the left lobe. Gallbladder physiologically distended, no
calcified stone. No biliary dilatation.

Pancreas: No ductal dilatation or inflammation.

Spleen: Normal in size without focal abnormality.

Adrenals/Urinary Tract: No adrenal nodule. No hydronephrosis or
perinephric edema. Symmetric renal enhancement and excretion on
delayed phase imaging. Urinary bladder is physiologically distended,
no wall thickening.

Stomach/Bowel: Stomach distended with ingested contrast. No small
bowel dilatation or inflammation. Normal appendix. Small to moderate
stool burden without colonic wall thickening or inflammation.

Vascular/Lymphatic: No significant vascular findings are present. No
enlarged abdominal or pelvic lymph nodes.

Reproductive: Uterus and bilateral adnexa are unremarkable.

Other: No free air, free fluid, or intra-abdominal fluid collection.

Musculoskeletal: There are no acute or suspicious osseous
abnormalities. Multilevel degenerative change throughout spine with
disc space narrowing and vacuum phenomenon. Transitional lumbosacral
anatomy.
IMPRESSION: No acute abnormality or explanation for abdominal pain and vomiting.

## 2017-02-07 DIAGNOSIS — R1312 Dysphagia, oropharyngeal phase: Secondary | ICD-10-CM | POA: Diagnosis not present

## 2017-02-08 ENCOUNTER — Other Ambulatory Visit: Payer: Self-pay | Admitting: Student

## 2017-02-08 DIAGNOSIS — R1312 Dysphagia, oropharyngeal phase: Secondary | ICD-10-CM

## 2017-03-01 ENCOUNTER — Ambulatory Visit: Payer: Federal, State, Local not specified - PPO

## 2017-03-26 DIAGNOSIS — K080 Exfoliation of teeth due to systemic causes: Secondary | ICD-10-CM | POA: Diagnosis not present

## 2017-03-28 ENCOUNTER — Other Ambulatory Visit: Payer: Self-pay

## 2017-03-28 ENCOUNTER — Encounter: Payer: Self-pay | Admitting: Family Medicine

## 2017-03-28 ENCOUNTER — Ambulatory Visit (INDEPENDENT_AMBULATORY_CARE_PROVIDER_SITE_OTHER): Payer: Federal, State, Local not specified - PPO | Admitting: Family Medicine

## 2017-03-28 DIAGNOSIS — J0100 Acute maxillary sinusitis, unspecified: Secondary | ICD-10-CM | POA: Diagnosis not present

## 2017-03-28 DIAGNOSIS — J329 Chronic sinusitis, unspecified: Secondary | ICD-10-CM | POA: Insufficient documentation

## 2017-03-28 DIAGNOSIS — M5442 Lumbago with sciatica, left side: Secondary | ICD-10-CM | POA: Diagnosis not present

## 2017-03-28 DIAGNOSIS — F5101 Primary insomnia: Secondary | ICD-10-CM

## 2017-03-28 DIAGNOSIS — M5441 Lumbago with sciatica, right side: Secondary | ICD-10-CM | POA: Diagnosis not present

## 2017-03-28 MED ORDER — DOXYCYCLINE HYCLATE 100 MG PO TABS
100.0000 mg | ORAL_TABLET | Freq: Two times a day (BID) | ORAL | 0 refills | Status: DC
Start: 2017-03-28 — End: 2017-05-08

## 2017-03-28 MED ORDER — BENZONATATE 200 MG PO CAPS: 200.0000 mg | ORAL_CAPSULE | Freq: Two times a day (BID) | ORAL | 0 refills | Status: DC | PRN

## 2017-03-28 NOTE — Assessment & Plan Note (Signed)
Suspect current issues with sleeping is related to her illness.  She will monitor.

## 2017-03-28 NOTE — Assessment & Plan Note (Signed)
No longer bothering her.  Monitor for recurrence.

## 2017-03-28 NOTE — Progress Notes (Addendum)
  Tommi Rumps, MD Phone: [phone]  RIELEY KHALSA Tammy Gregory is a 57 y.o. female who presents today for same-day visit.  Patient notes over a week of cough and congestion.  Started with body aches and then developed a fever over the weekend.  Fever has gone away.  Some sinus congestion headaches.  Sore throat in the morning with postnasal drip.  Blowing yellow mucus out of her nose.  Her ears have become uncomfortable today.  She feels tired.  She took Vicodin for her body aches.  She has not been drinking as many fluids she needs to.  No nausea, vomiting, or diarrhea.  Travel to [place] 2 weeks ago though had no symptoms while there.  She reports over the last week she has gone back to taking trazodone and melatonin for sleep.  Some benefit.  She did previously have an acute issue with back pain and was seen by Dr. Lacinda Axon.  The Flexeril did not help.  The gabapentin took care of and she has not had any recurrence.  Social History   Tobacco Use  Smoking Status Never Smoker  Smokeless Tobacco Never Used     ROS see history of present illness  Objective  Physical Exam Vitals:   03/28/17 0846  BP: 140/90  Pulse: 70  Temp: 97.6 F (36.4 C)  SpO2: 97%    BP Readings from Last 3 Encounters:  03/28/17 140/90  10/09/16 128/90  08/23/16 126/80   Wt Readings from Last 3 Encounters:  03/28/17 205 lb (93 kg)  10/09/16 201 lb 8 oz (91.4 kg)  08/23/16 197 lb 6.4 oz (89.5 kg)    Physical Exam  Constitutional: No distress.  HENT:  Head: Normocephalic and atraumatic.  Mouth/Throat: Oropharynx is clear and moist. No oropharyngeal exudate.  Normal TMs bilaterally, maxillary sinuses tender to percussion bilaterally  Eyes: Conjunctivae are normal. Pupils are equal, round, and reactive to light.  Neck: Neck supple.  Cardiovascular: Normal rate, regular rhythm and normal heart sounds.  Pulmonary/Chest: Effort normal and breath sounds normal.  Musculoskeletal: She exhibits no  edema.  Lymphadenopathy:    She has no cervical adenopathy.  Neurological: She is alert. Gait normal.  Skin: Skin is warm and dry. She is not diaphoretic.     Assessment/Plan: Please see individual problem list.  Sinusitis Symptoms most consistent with sinusitis.  Will treat with doxycycline given diarrhea with amoxicillin.  Tessalon for cough.  Given return precautions.  Acute bilateral low back pain with bilateral sciatica No longer bothering her.  Monitor for recurrence.  Insomnia Suspect current issues with sleeping is related to her illness.  She will monitor.   No orders of the defined types were placed in this encounter.   Meds ordered this encounter  Medications  . doxycycline (VIBRA-TABS) 100 MG tablet    Sig: Take 1 tablet (100 mg total) by mouth 2 (two) times daily.    Dispense:  14 tablet    Refill:  0  . benzonatate (TESSALON) 200 MG capsule    Sig: Take 1 capsule (200 mg total) by mouth 2 (two) times daily as needed for cough.    Dispense:  20 capsule    Refill:  0     Tommi Rumps, MD [place]

## 2017-03-28 NOTE — Assessment & Plan Note (Signed)
Symptoms most consistent with sinusitis.  Will treat with doxycycline given diarrhea with amoxicillin.  Tessalon for cough.  Given return precautions.

## 2017-03-28 NOTE — Patient Instructions (Signed)
Nice to see you. Your symptoms are likely related to a sinus infection.  We will treat you with doxycycline. If you develop cough productive of blood or shortness of breath please be evaluated in the emergency room.  If your symptoms worsen please let us know.  If you develop fever please let us know. If you are not improving please contact us.

## 2017-03-30 ENCOUNTER — Ambulatory Visit
Admission: RE | Admit: 2017-03-30 | Discharge: 2017-03-30 | Disposition: A | Discharge status: Home / Self Care | Payer: Federal, State, Local not specified - PPO | Source: Ambulatory Visit | Attending: Student | Admitting: Student

## 2017-03-30 DIAGNOSIS — R1312 Dysphagia, oropharyngeal phase: Secondary | ICD-10-CM | POA: Diagnosis not present

## 2017-03-30 DIAGNOSIS — R1310 Dysphagia, unspecified: Secondary | ICD-10-CM | POA: Diagnosis not present

## 2017-03-30 NOTE — Therapy (Signed)
[place], [place], [address] Phone: [phone]   Fax:     Modified Barium Swallow  Patient Details  Name: Tammy Gregory MRN: [id number] Date of Birth: 1960-05-14 No Data Recorded  Encounter Date: 03/30/2017  End of Session - 03/30/17 1311    Visit Number  1    Number of Visits  1    Date for SLP Re-Evaluation  03/30/17    SLP Start Time  16    SLP Stop Time   1311    SLP Time Calculation (min)  41 min    Activity Tolerance  Patient tolerated treatment well       Past Medical History:  Diagnosis Date  . Chicken pox   . Headache   . Hypertension     Past Surgical History:  Procedure Laterality Date  . CERVICAL FUSION  12/03/2014  . CESAREAN SECTION     3  . COLONOSCOPY WITH PROPOFOL N/A 04/02/2015   Procedure: COLONOSCOPY WITH PROPOFOL;  Surgeon: Lollie Sails, MD;  Location: [facility] ENDOSCOPY;  Service: Endoscopy;  Laterality: N/A;    There were no vitals filed for this visit.   Subjective: Patient behavior: (alertness, ability to follow instructions, etc.): The patient is able to verbalize her swallowing history and follow instructions Chief complaint: increased choking, S/P spinal fusion 3  years ago   Objective:  Radiological Procedure: A videoflouroscopic evaluation of oral-preparatory, reflex initiation, and pharyngeal phases of the swallow was performed; as well as a screening of the upper esophageal phase.  I. POSTURE: Upright in MBS chair and standing  II. VIEW: Lateral and A-P  III. COMPENSATORY STRATEGIES: N/A  IV. BOLUSES ADMINISTERED:   Thin Liquid: 2 small, 3 rapid consecutive   Nectar-thick Liquid: 2 moderate   Honey-thick Liquid: DNT   Puree: 3 teaspoon presentations   Mechanical Soft: 1/4 graham cracker in applesauce  V. RESULTS OF EVALUATION: A. ORAL PREPARATORY PHASE: (The lips, tongue, and velum are observed for strength and  coordination)       **Overall Severity Rating: Within normal limits  B. SWALLOW INITIATION/REFLEX: (The reflex is normal if "triggered" by the time the bolus reached the base of the tongue)  **Overall Severity Rating: Within normal limits  C. PHARYNGEAL PHASE: (Pharyngeal function is normal if the bolus shows rapid, smooth, and continuous transit through the pharynx and there is no pharyngeal residue after the swallow)  **Overall Severity Rating: Within normal limits   D. LARYNGEAL PENETRATION: (Material entering into the laryngeal inlet/vestibule but not aspirated) None  E. ASPIRATION: None  F. ESOPHAGEAL PHASE: (Screening of the upper esophagus) An esophageal sweep in the upright position with liquid and solid consistencies showed mid-esophageal retention of solids without retrograde flow.     ASSESSMENT: This 57 year old woman; with complaint of increased choking, S/P spinal fusion 3  years ago; is presenting with normal oropharyngeal swallowing.  Oral control of the bolus including oral hold, rotary mastication, and anterior to posterior transfer is within normal limits. Timing of the pharyngeal swallow is within normal limits.  Aspects of the pharyngeal stage of swallowing including tongue base retraction, hyolaryngeal excursion, epiglottic inversion, and duration/amplitude of UES opening are within normal limits.  There is no observed pharyngeal residue, laryngeal penetration, or tracheal aspiration.  The patient's complaints do not appear to be due to oropharyngeal swallow function.  An esophageal sweep in the upright position with liquid and solid consistencies showed mid-esophageal retention  of solids without retrograde flow.   The patient is experiencing change in voice and may benefit from referral to an ENT for assessment.  PLAN/RECOMMENDATIONS:   A. Diet: Regular   B. Swallowing Precautions: No special precautions required   C. Recommended consultation to: ENT secondary change  in voice   D. Therapy recommendations: speech therapy is not indicated at this time   E. Results and recommendations were discussed with the patient immediately following the study and the final report routed to the referring practitioner      Oropharyngeal dysphagia - Plan: DG Swallowing Func-Speech Pathology, DG Swallowing Func-Speech Pathology        Problem List Patient Active Problem List   Diagnosis Date Noted  . Sinusitis 03/28/2017  . Acute bilateral low back pain with bilateral sciatica 10/10/2016  . Insomnia 02/28/2016  . Pure hypercholesterolemia 02/24/2016  . Mild depression ([place]) 07/23/2015  . Obesity (BMI 35.0-39.9 without comorbidity) 12/28/2014  . History of hypertension 12/28/2014  . S/P cervical spinal fusion 12/28/2014  . Preventative health care 12/28/2014  . Family history of breast cancer 10/11/2011   Leroy Sea, MS/CCC- SLP  Lou Miner 03/30/2017, 1:11 PM  [place], [place], [address] Phone: [phone]   Fax:     Name: Tammy Gregory MRN: [id number] Date of Birth: [birth year]

## 2017-04-02 ENCOUNTER — Other Ambulatory Visit: Payer: Self-pay | Admitting: Student

## 2017-04-02 DIAGNOSIS — R1319 Other dysphagia: Secondary | ICD-10-CM

## 2017-04-02 DIAGNOSIS — R1310 Dysphagia, unspecified: Secondary | ICD-10-CM

## 2017-05-01 ENCOUNTER — Ambulatory Visit: Payer: Federal, State, Local not specified - PPO | Admitting: Family Medicine

## 2017-05-08 ENCOUNTER — Ambulatory Visit: Payer: Federal, State, Local not specified - PPO | Admitting: Family Medicine

## 2017-05-08 ENCOUNTER — Encounter: Payer: Self-pay | Admitting: Family Medicine

## 2017-05-08 VITALS — BP 122/80 | HR 82 | Temp 97.7°F | Ht 63.0 in | Wt 204.4 lb

## 2017-05-08 DIAGNOSIS — K760 Fatty (change of) liver, not elsewhere classified: Secondary | ICD-10-CM

## 2017-05-08 DIAGNOSIS — Z8679 Personal history of other diseases of the circulatory system: Secondary | ICD-10-CM | POA: Diagnosis not present

## 2017-05-08 DIAGNOSIS — G5603 Carpal tunnel syndrome, bilateral upper limbs: Secondary | ICD-10-CM

## 2017-05-08 DIAGNOSIS — E669 Obesity, unspecified: Secondary | ICD-10-CM | POA: Diagnosis not present

## 2017-05-08 DIAGNOSIS — E7800 Pure hypercholesterolemia, unspecified: Secondary | ICD-10-CM | POA: Diagnosis not present

## 2017-05-08 DIAGNOSIS — R600 Localized edema: Secondary | ICD-10-CM

## 2017-05-08 DIAGNOSIS — R1310 Dysphagia, unspecified: Secondary | ICD-10-CM

## 2017-05-08 MED ORDER — WRIST SPLINT/COCK-UP/LEFT M MISC: 0 refills | Status: DC

## 2017-05-08 MED ORDER — WRIST SPLINT/COCK-UP/RIGHT M MISC: 0 refills | Status: DC

## 2017-05-08 NOTE — Progress Notes (Signed)
Tommi Rumps, MD Phone: [phone]  Tammy Gregory is a 57 y.o. female who presents today for follow-up.  Carpal tunnel syndrome: Patient notes she had nerve conduction studies previously that revealed carpal tunnel issues.  She notes intermittent numbness in her hands and pain as well that radiates to her forearms.  No weakness.  Notes her hands just feel strange.  She did not have any evaluation previously given that she was asymptomatic.  Fatty liver: Has been followed by GI for this.  They recommended evaluation with labs and follow-up imaging though the patient deferred this to [place].  Their recommendations have been reviewed and ordered.  She was also seeing GI for dysphagia.  She reports she had a swallow study previously that did not reveal a specific cause for her symptoms.  She notes they recommended an additional test which she was unsure of whether or not she needed to do given the cost concerns.  Obesity: Notes she has limited time for exercise.  No desire or passion to do exercise.  She has reduced her fried foods and carb intake.  She reports she was on blood pressure medications previously.  She notes her blood pressure has trended up though remains in the normal range around 130/70.  She notes she feels a little bloated and has some swelling in her legs at times.  No chest pain or shortness of breath.  Social History   Tobacco Use  Smoking Status Never Smoker  Smokeless Tobacco Never Used     ROS see history of present illness  Objective  Physical Exam Vitals:   05/08/17 0803  BP: 122/80  Pulse: 82  Temp: 97.7 F (36.5 C)  SpO2: 97%    BP Readings from Last 3 Encounters:  05/08/17 122/80  03/28/17 140/90  10/09/16 128/90   Wt Readings from Last 3 Encounters:  05/08/17 204 lb 6.4 oz (92.7 kg)  03/28/17 205 lb (93 kg)  10/09/16 201 lb 8 oz (91.4 kg)    Physical Exam  Constitutional: No distress.  Cardiovascular: Normal rate, regular rhythm  and normal heart sounds.  Pulmonary/Chest: Effort normal and breath sounds normal.  Abdominal: Soft. Bowel sounds are normal. She exhibits no distension. There is no tenderness. There is no rebound and no guarding.  Musculoskeletal: She exhibits no edema.  Positive Phalen's bilaterally, positive Tinel's at the left wrist, 5/5 strength bilateral grip, sensation intact bilateral hands  Neurological: She is alert. Gait normal.  Skin: Skin is warm and dry. She is not diaphoretic.     Assessment/Plan: Please see individual problem list.  Fatty liver Lab work and right upper quadrant ultrasound ordered after reviewing recommendations from most recent GI note.  She will follow-up with GI as well.  Bilateral carpal tunnel syndrome History and exam consistent with carpal tunnel.  We will place her in cockup splints.  Will refer to orthopedics.  Pure hypercholesterolemia Return for fasting lipid panel.  Obesity (BMI 35.0-39.9 without comorbidity) Advised on diet and exercise.  Discussed that this would help with her fatty liver.  Dysphagia Discussed need to follow-up with GI to complete workup.  She advised me that she would contact them to set this up on her own.  History of hypertension Remains well controlled.  She will check consistently at home and if trends up let us know.  Leg edema We will check lab work to rule out underlying causes.   Orders Placed This Encounter  Procedures  . US Abdomen Limited RUQ  Standing Status:   Future    Standing Expiration Date:   07/09/2018    Order Specific Question:   Reason for Exam (SYMPTOM  OR DIAGNOSIS REQUIRED)    Answer:   fatty liver, follow-up US    Order Specific Question:   Preferred imaging location?    Answer:   [facility]  . Comp Met (CMET)    Standing Status:   Future    Standing Expiration Date:   05/09/2018  . CBC    Standing Status:   Future    Standing Expiration Date:   05/09/2018  . TSH    Standing Status:   Future     Standing Expiration Date:   05/09/2018  . Hepatitis B Core Antibody, total    Standing Status:   Future    Standing Expiration Date:   05/09/2018  . Hepatitis B Surface AntiBODY    Standing Status:   Future    Standing Expiration Date:   05/09/2018  . Hepatitis B Surface AntiGEN    Standing Status:   Future    Standing Expiration Date:   05/09/2018  . Hepatitis A Ab, Total    Standing Status:   Future    Standing Expiration Date:   05/09/2018  . Lipid panel    Standing Status:   Future    Standing Expiration Date:   05/09/2018  . INR/PT    Standing Status:   Future    Standing Expiration Date:   05/09/2018  . Ambulatory referral to Orthopedic Surgery    Referral Priority:   Routine    Referral Type:   Surgical    Referral Reason:   Specialty Services Required    Requested Specialty:   Orthopedic Surgery    Number of Visits Requested:   1    Meds ordered this encounter  Medications  . Elastic Bandages & Supports (WRIST SPLINT/COCK-UP/LEFT M) MISC    Sig: Apply nightly to wrist    Dispense:  1 each    Refill:  0  . Elastic Bandages & Supports (WRIST SPLINT/COCK-UP/RIGHT M) MISC    Sig: Apply nightly to wrist    Dispense:  1 each    Refill:  0     Tommi Rumps, MD [place]

## 2017-05-08 NOTE — Patient Instructions (Signed)
Nice to see you. We will get you set up with orthopedics.  Please use the braces for your wrists.  We will have you return for lab work.  Please contact GI to get set up for the follow-up study. Please work on diet and exercise as well. Please start checking her blood pressure at home. Please make sure we get access to your recent Pap smear results.

## 2017-05-10 ENCOUNTER — Ambulatory Visit: Payer: Federal, State, Local not specified - PPO

## 2017-05-10 DIAGNOSIS — R1310 Dysphagia, unspecified: Secondary | ICD-10-CM | POA: Insufficient documentation

## 2017-05-10 DIAGNOSIS — K760 Fatty (change of) liver, not elsewhere classified: Secondary | ICD-10-CM | POA: Insufficient documentation

## 2017-05-10 DIAGNOSIS — R600 Localized edema: Secondary | ICD-10-CM | POA: Insufficient documentation

## 2017-05-10 DIAGNOSIS — G5603 Carpal tunnel syndrome, bilateral upper limbs: Secondary | ICD-10-CM | POA: Insufficient documentation

## 2017-05-10 NOTE — Assessment & Plan Note (Signed)
Discussed need to follow-up with GI to complete workup.  She advised me that she would contact them to set this up on her own.

## 2017-05-10 NOTE — Assessment & Plan Note (Signed)
We will check lab work to rule out underlying causes.

## 2017-05-10 NOTE — Assessment & Plan Note (Signed)
Advised on diet and exercise.  Discussed that this would help with her fatty liver.

## 2017-05-10 NOTE — Assessment & Plan Note (Signed)
Return for fasting lipid panel.

## 2017-05-10 NOTE — Assessment & Plan Note (Signed)
Lab work and right upper quadrant ultrasound ordered after reviewing recommendations from most recent GI note.  She will follow-up with GI as well.

## 2017-05-10 NOTE — Assessment & Plan Note (Signed)
Remains well controlled.  She will check consistently at home and if trends up let us know.

## 2017-05-10 NOTE — Assessment & Plan Note (Signed)
History and exam consistent with carpal tunnel.  We will place her in cockup splints.  Will refer to orthopedics.

## 2017-05-11 ENCOUNTER — Other Ambulatory Visit (INDEPENDENT_AMBULATORY_CARE_PROVIDER_SITE_OTHER): Payer: Federal, State, Local not specified - PPO

## 2017-05-11 ENCOUNTER — Ambulatory Visit
Admission: RE | Admit: 2017-05-11 | Discharge: 2017-05-11 | Disposition: A | Discharge status: Home / Self Care | Payer: Federal, State, Local not specified - PPO | Source: Ambulatory Visit | Attending: Family Medicine | Admitting: Family Medicine

## 2017-05-11 DIAGNOSIS — K760 Fatty (change of) liver, not elsewhere classified: Secondary | ICD-10-CM | POA: Diagnosis not present

## 2017-05-11 DIAGNOSIS — R600 Localized edema: Secondary | ICD-10-CM | POA: Diagnosis not present

## 2017-05-11 DIAGNOSIS — E7800 Pure hypercholesterolemia, unspecified: Secondary | ICD-10-CM | POA: Diagnosis not present

## 2017-05-11 LAB — CBC
HCT: 41.8 % (ref 36.0–46.0)
Hemoglobin: 14 g/dL (ref 12.0–15.0)
MCHC: 33.4 g/dL (ref 30.0–36.0)
MCV: 88.4 fl (ref 78.0–100.0)
Platelets: 222 10*3/uL (ref 150.0–400.0)
RBC: 4.73 Mil/uL (ref 3.87–5.11)
RDW: 13.9 % (ref 11.5–15.5)
WBC: 6.5 10*3/uL (ref 4.0–10.5)

## 2017-05-11 LAB — COMPREHENSIVE METABOLIC PANEL
ALT: 12 U/L (ref 0–35)
AST: 14 U/L (ref 0–37)
Albumin: 4 g/dL (ref 3.5–5.2)
Alkaline Phosphatase: 74 U/L (ref 39–117)
BUN: 15 mg/dL (ref 6–23)
CO2: 31 mEq/L (ref 19–32)
Calcium: 9.4 mg/dL (ref 8.4–10.5)
Chloride: 105 mEq/L (ref 96–112)
Creatinine, Ser: 0.86 mg/dL (ref 0.40–1.20)
GFR: 72.32 mL/min (ref >60.00)
Glucose, Bld: 94 mg/dL (ref 70–99)
Potassium: 3.8 mEq/L (ref 3.5–5.1)
Sodium: 142 mEq/L (ref 135–145)
Total Bilirubin: 0.5 mg/dL (ref 0.2–1.2)
Total Protein: 7.1 g/dL (ref 6.0–8.3)

## 2017-05-11 LAB — LIPID PANEL
Cholesterol: 163 mg/dL (ref 0–200)
HDL: 52.3 mg/dL (ref >39.00)
LDL Cholesterol: 91 mg/dL (ref 0–99)
NonHDL: 110.8
Total CHOL/HDL Ratio: 3
Triglycerides: 101 mg/dL (ref 0.0–149.0)
VLDL: 20.2 mg/dL (ref 0.0–40.0)

## 2017-05-11 LAB — TSH: TSH: 3.31 u[IU]/mL (ref 0.35–4.50)

## 2017-05-11 LAB — PROTIME-INR
INR: 1.1 ratio — ABNORMAL HIGH (ref 0.8–1.0)
Prothrombin Time: 12.1 s (ref 9.6–13.1)

## 2017-05-12 LAB — HEPATITIS B SURFACE ANTIGEN: Hepatitis B Surface Ag: NONREACTIVE

## 2017-05-12 LAB — HEPATITIS B CORE ANTIBODY, TOTAL: Hep B Core Total Ab: NONREACTIVE

## 2017-05-12 LAB — HEPATITIS A ANTIBODY, TOTAL: Hepatitis A AB,Total: REACTIVE — AB

## 2017-05-12 LAB — HEPATITIS B SURFACE ANTIBODY,QUALITATIVE: Hep B S Ab: REACTIVE — AB

## 2017-06-08 ENCOUNTER — Ambulatory Visit: Payer: Federal, State, Local not specified - PPO | Attending: Student

## 2017-06-14 ENCOUNTER — Ambulatory Visit
Admission: EM | Admit: 2017-06-14 | Discharge: 2017-06-14 | Disposition: A | Discharge status: Home / Self Care | Payer: Federal, State, Local not specified - PPO | Attending: Family Medicine | Admitting: Family Medicine

## 2017-06-14 ENCOUNTER — Other Ambulatory Visit: Payer: Self-pay

## 2017-06-14 DIAGNOSIS — R059 Cough, unspecified: Secondary | ICD-10-CM

## 2017-06-14 DIAGNOSIS — R05 Cough: Secondary | ICD-10-CM

## 2017-06-14 DIAGNOSIS — J302 Other seasonal allergic rhinitis: Secondary | ICD-10-CM

## 2017-06-14 MED ORDER — AZITHROMYCIN 250 MG PO TABS: 250.0000 mg | ORAL_TABLET | Freq: Every day | ORAL | 0 refills | Status: DC

## 2017-06-14 MED ORDER — HYDROCOD POLST-CPM POLST ER 10-8 MG/5ML PO SUER: 5.0000 mL | Freq: Two times a day (BID) | ORAL | 0 refills | Status: DC

## 2017-06-14 MED ORDER — ALBUTEROL SULFATE HFA 108 (90 BASE) MCG/ACT IN AERS: 1.0000 | INHALATION_SPRAY | Freq: Four times a day (QID) | RESPIRATORY_TRACT | 0 refills | Status: DC | PRN

## 2017-06-14 MED ORDER — BENZONATATE 200 MG PO CAPS: ORAL_CAPSULE | ORAL | 0 refills | Status: DC

## 2017-06-14 NOTE — Discharge Instructions (Signed)
Recommend you use Flonase and Allegra Zyrtec or Claritin on a daily basis throughout the pollen season.

## 2017-06-14 NOTE — ED Provider Notes (Signed)
[facility]    CSN: [id number] Arrival date & time: 06/14/17  1357     History   Chief Complaint Chief Complaint  Patient presents with  . Cough    HPI Sister Carbone is a 57 y.o. female.   HPI  57 year old female presents with a nonproductive cough had for a few days.  It is still mild loss of her voice.  She has had no nasal drainage.  He said her chest feels tight particularly when she coughs.  Been affecting her diaphragmatic area.  Afebrile without chills.  Has been having shortness of breath and has been using her husband's albuterol inhaler with some success.        Past Medical History:  Diagnosis Date  . Chicken pox   . Headache   . Hypertension     Patient Active Problem List   Diagnosis Date Noted  . Fatty liver 05/10/2017  . Leg edema 05/10/2017  . Bilateral carpal tunnel syndrome 05/10/2017  . Dysphagia 05/10/2017  . Acute bilateral low back pain with bilateral sciatica 10/10/2016  . Insomnia 02/28/2016  . Pure hypercholesterolemia 02/24/2016  . Mild depression ([place]) 07/23/2015  . Obesity (BMI 35.0-39.9 without comorbidity) 12/28/2014  . History of hypertension 12/28/2014  . S/P cervical spinal fusion 12/28/2014  . Family history of breast cancer 10/11/2011    Past Surgical History:  Procedure Laterality Date  . CERVICAL FUSION  12/03/2014  . CESAREAN SECTION     3  . COLONOSCOPY WITH PROPOFOL N/A 04/02/2015   Procedure: COLONOSCOPY WITH PROPOFOL;  Surgeon: Lollie Sails, MD;  Location: [facility] ENDOSCOPY;  Service: Endoscopy;  Laterality: N/A;    OB History   None      Home Medications    Prior to Admission medications   Medication Sig Start Date End Date Taking? Authorizing Provider  albuterol (PROVENTIL HFA;VENTOLIN HFA) 108 (90 Base) MCG/ACT inhaler Inhale 1-2 puffs into the lungs every 6 (six) hours as needed for wheezing or shortness of breath. Use with spacer 06/14/17   Lorin Picket, PA-C  azithromycin  (ZITHROMAX) 250 MG tablet Take 1 tablet (250 mg total) by mouth daily. Take first 2 tablets together, then 1 every day until finished. 06/21/17   Lorin Picket, PA-C  benzonatate (TESSALON) 200 MG capsule Take one cap TID PRN cough 06/14/17   Lorin Picket, PA-C  chlorpheniramine-HYDROcodone [facility]) 10-8 MG/5ML SUER Take 5 mLs by mouth 2 (two) times daily. 06/14/17   Lorin Picket, PA-C  Elastic Bandages & Supports (WRIST SPLINT/COCK-UP/LEFT M) MISC Apply nightly to wrist 05/08/17   Leone Haven, MD  Elastic Bandages & Supports (WRIST SPLINT/COCK-UP/RIGHT M) MISC Apply nightly to wrist 05/08/17   Leone Haven, MD    Family History Family History  Problem Relation Age of Onset  . Hypertension Father     Social History Social History   Tobacco Use  . Smoking status: Never Smoker  . Smokeless tobacco: Never Used  Substance Use Topics  . Alcohol use: Yes    Comment: occ  . Drug use: No     Allergies   Codeine; Amoxicillin; and Clindamycin   Review of Systems Review of Systems  Constitutional: Positive for activity change. Negative for appetite change, chills, fatigue and fever.  Respiratory: Positive for cough and shortness of breath.   All other systems reviewed and are negative.    Physical Exam Triage Vital Signs ED Triage Vitals  Enc Vitals Group  BP 06/14/17 1408 (!) 128/91     Pulse Rate 06/14/17 1408 80     Resp 06/14/17 1408 18     Temp 06/14/17 1408 98 F (36.7 C)     Temp Source 06/14/17 1408 Oral     SpO2 06/14/17 1408 98 %     Weight --      Height --      Head Circumference --      Peak Flow --      Pain Score 06/14/17 1409 3     Pain Loc --      Pain Edu? --      Excl. in [place]? --    No data found.  Updated Vital Signs BP (!) 128/91 (BP Location: Left Arm)   Pulse 80   Temp 98 F (36.7 C) (Oral)   Resp 18   LMP 04/02/2007 (Approximate)   SpO2 98%   Visual Acuity Right Eye Distance:   Left Eye  Distance:   Bilateral Distance:    Right Eye Near:   Left Eye Near:    Bilateral Near:     Physical Exam  Constitutional: She is oriented to person, place, and time. She appears well-developed and well-nourished. No distress.  HENT:  Head: Normocephalic.  Right Ear: External ear normal.  Left Ear: External ear normal.  Nose: Nose normal.  Mouth/Throat: Oropharynx is clear and moist. No oropharyngeal exudate.  Eyes: Pupils are equal, round, and reactive to light. Right eye exhibits no discharge. Left eye exhibits no discharge.  Neck: Normal range of motion.  Pulmonary/Chest: Effort normal and breath sounds normal.  Musculoskeletal: Normal range of motion.  Lymphadenopathy:    She has no cervical adenopathy.  Neurological: She is alert and oriented to person, place, and time.  Skin: Skin is warm and dry. She is not diaphoretic.  Psychiatric: She has a normal mood and affect. Her behavior is normal. Judgment and thought content normal.  Nursing note and vitals reviewed.    UC Treatments / Results  Labs (all labs ordered are listed, but only abnormal results are displayed) Labs Reviewed - No data to display  EKG None Radiology No results found.  Procedures Procedures (including critical care time)  Medications Ordered in UC Medications - No data to display   Initial Impression / Assessment and Plan / UC Course  I have reviewed the triage vital signs and the nursing notes.  Pertinent labs & imaging results that were available during my care of the patient were reviewed by me and considered in my medical decision making (see chart for details).     Plan: 1. Test/x-ray results and diagnosis reviewed with patient 2. rx as per orders; risks, benefits, potential side effects reviewed with patient 3. Recommend supportive treatment with albuterol inhaler with a spacer as necessary for shortness of breath.  Provide her with symptomatic cough suppressants.  Continues to have  her symptoms in a week I have postdated a prescription for azithromycin so she does not return to the clinic.  Worsen she may return to the clinic or go to her primary care physician 4. F/u prn if symptoms worsen or don't improve   Final Clinical Impressions(s) / UC Diagnoses   Final diagnoses:  Cough  Seasonal allergies    ED Discharge Orders        Ordered    azithromycin (ZITHROMAX) 250 MG tablet  Daily     06/14/17 1435    albuterol (PROVENTIL HFA;VENTOLIN HFA) 108 (90 Base)  MCG/ACT inhaler  Every 6 hours PRN    Note to Pharmacy:  Provide spacer and instructions to patient   06/14/17 1435    benzonatate (TESSALON) 200 MG capsule     06/14/17 1435    chlorpheniramine-HYDROcodone (TUSSIONEX PENNKINETIC ER) 10-8 MG/5ML SUER  2 times daily     06/14/17 1435       Controlled Substance Prescriptions [place] Controlled Substance Registry consulted? Not Applicable   Lorin Picket, PA-C 06/14/17 2048

## 2017-06-14 NOTE — ED Triage Notes (Signed)
Pt with non-productive cough x past few days since pollen and has started to be hoarse. Denies nasal drainage. Chest feels tight.

## 2017-06-15 ENCOUNTER — Telehealth: Payer: Self-pay | Admitting: Family Medicine

## 2017-06-15 ENCOUNTER — Ambulatory Visit: Payer: Federal, State, Local not specified - PPO | Admitting: Internal Medicine

## 2017-06-15 NOTE — Telephone Encounter (Signed)
Copied from [place] [phone]. Topic: Quick Communication - Appointment Cancellation >> Jun 15, 2017  8:27 AM Scherrie Gerlach wrote: Patient called to cancel appointment scheduled for 9 am. Patient HAS NOT rescheduled their appointment.  pt went to the ED last night/got meds/feels better Route to department's PEC pool.

## 2017-06-15 NOTE — Telephone Encounter (Signed)
Patient was 0900 appointment that was canceled. FYI

## 2017-07-10 DIAGNOSIS — R87810 Cervical high risk human papillomavirus (HPV) DNA test positive: Secondary | ICD-10-CM | POA: Diagnosis not present

## 2017-07-10 DIAGNOSIS — Z01411 Encounter for gynecological examination (general) (routine) with abnormal findings: Secondary | ICD-10-CM | POA: Diagnosis not present

## 2017-07-10 DIAGNOSIS — Z1231 Encounter for screening mammogram for malignant neoplasm of breast: Secondary | ICD-10-CM | POA: Diagnosis not present

## 2017-08-10 ENCOUNTER — Ambulatory Visit: Payer: Federal, State, Local not specified - PPO | Admitting: Family Medicine

## 2017-08-10 ENCOUNTER — Other Ambulatory Visit: Payer: Self-pay | Admitting: Family Medicine

## 2017-08-10 ENCOUNTER — Encounter: Payer: Self-pay | Admitting: Family Medicine

## 2017-08-10 VITALS — BP 136/90 | HR 75 | Temp 97.6°F | Ht 63.0 in | Wt 206.2 lb

## 2017-08-10 DIAGNOSIS — F32A Depression, unspecified: Secondary | ICD-10-CM

## 2017-08-10 DIAGNOSIS — G5603 Carpal tunnel syndrome, bilateral upper limbs: Secondary | ICD-10-CM

## 2017-08-10 DIAGNOSIS — R0609 Other forms of dyspnea: Secondary | ICD-10-CM | POA: Diagnosis not present

## 2017-08-10 DIAGNOSIS — R0600 Dyspnea, unspecified: Secondary | ICD-10-CM

## 2017-08-10 DIAGNOSIS — F419 Anxiety disorder, unspecified: Secondary | ICD-10-CM | POA: Diagnosis not present

## 2017-08-10 DIAGNOSIS — R1310 Dysphagia, unspecified: Secondary | ICD-10-CM | POA: Diagnosis not present

## 2017-08-10 DIAGNOSIS — F329 Major depressive disorder, single episode, unspecified: Secondary | ICD-10-CM

## 2017-08-10 DIAGNOSIS — I10 Essential (primary) hypertension: Secondary | ICD-10-CM

## 2017-08-10 LAB — COMPREHENSIVE METABOLIC PANEL
ALT: 13 U/L (ref 0–35)
AST: 15 U/L (ref 0–37)
Albumin: 4.2 g/dL (ref 3.5–5.2)
Alkaline Phosphatase: 66 U/L (ref 39–117)
BUN: 18 mg/dL (ref 6–23)
CO2: 29 mEq/L (ref 19–32)
Calcium: 9.3 mg/dL (ref 8.4–10.5)
Chloride: 107 mEq/L (ref 96–112)
Creatinine, Ser: 0.83 mg/dL (ref 0.40–1.20)
GFR: 75.28 mL/min (ref >60.00)
Glucose, Bld: 95 mg/dL (ref 70–99)
Potassium: 4.3 mEq/L (ref 3.5–5.1)
Sodium: 142 mEq/L (ref 135–145)
Total Bilirubin: 0.6 mg/dL (ref 0.2–1.2)
Total Protein: 7.2 g/dL (ref 6.0–8.3)

## 2017-08-10 LAB — HEMOGLOBIN A1C: Hgb A1c MFr Bld: 5.3 % (ref 4.6–6.5)

## 2017-08-10 LAB — LIPID PANEL
Cholesterol: 170 mg/dL (ref 0–200)
HDL: 51 mg/dL (ref >39.00)
LDL Cholesterol: 96 mg/dL (ref 0–99)
NonHDL: 119.13
Total CHOL/HDL Ratio: 3
Triglycerides: 115 mg/dL (ref 0.0–149.0)
VLDL: 23 mg/dL (ref 0.0–40.0)

## 2017-08-10 LAB — CBC
HCT: 42.5 % (ref 36.0–46.0)
Hemoglobin: 14.2 g/dL (ref 12.0–15.0)
MCHC: 33.3 g/dL (ref 30.0–36.0)
MCV: 89.6 fl (ref 78.0–100.0)
Platelets: 185 10*3/uL (ref 150.0–400.0)
RBC: 4.74 Mil/uL (ref 3.87–5.11)
RDW: 14.5 % (ref 11.5–15.5)
WBC: 5.1 10*3/uL (ref 4.0–10.5)

## 2017-08-10 LAB — BRAIN NATRIURETIC PEPTIDE: Pro B Natriuretic peptide (BNP): 23 pg/mL (ref 0.0–100.0)

## 2017-08-10 LAB — TSH: TSH: 1.93 u[IU]/mL (ref 0.35–4.50)

## 2017-08-10 MED ORDER — AMLODIPINE BESYLATE 5 MG PO TABS: 5.0000 mg | ORAL_TABLET | Freq: Every day | ORAL | 3 refills | Status: DC

## 2017-08-10 NOTE — Patient Instructions (Signed)
Nice to see you. Please monitor your stress, anxiety, and depression.  If these things worsen please let us know.  If you develop thoughts of harming your self or others please go to the emergency room. We will get lab work today and then likely place you on blood pressure medication. If you develop persistent shortness of breath or if you develop chest pain please be evaluated immediately. Please contact your GI physician for follow-up.

## 2017-08-10 NOTE — Progress Notes (Signed)
Tammy Rumps, MD Phone: [phone]  Tammy Gregory Tammy Gregory is a 57 y.o. female who presents today for f/u.  CC: htn, anxiety/depression  HYPERTENSION  Disease Monitoring  Home BP Monitoring 160/90-100 Chest pain- no    Dyspnea- yes, see below Medications  Compliance-  Not medications.  Edema- minimal in her ankles, no orthopnea or PND  Patient notes she has had some increased dyspnea on exertion recently.  She will get up 3-4 flights of stairs and half the rest.  She has been exercising recently and feels like she gets fairly out of breath.  She wonders if it is related to her weight gain.  She notes no orthopnea or PND.  No family history of heart disease.  She does not smoke.  She reports quite a bit of stress, anxiety, and a little bit of depression related to her husband's recent medical diagnosis of chronic kidney disease stage III.  She just does not know what to do for him diet wise for this.  She has been forgetting things like her keys and losing things.  She does feel anxious about this.  She stressed with school.  She notes no SI or HI.  She did not follow-up with GI for her dysphagia.  She was supposed to undergo an EGD.  She has no dysphagia issues currently.  She notes no blood in her stool.  She reports her carpal tunnel symptoms have resolved.  She wore the braces for 1 to 2 months.  She did not see orthopedics.    Social History   Tobacco Use  Smoking Status Never Smoker  Smokeless Tobacco Never Used     ROS see history of present illness  Objective  Physical Exam Vitals:   08/10/17 0826 08/10/17 0857  BP: (!) 150/82 136/90  Pulse: 75   Temp: 97.6 F (36.4 C)   SpO2: 97%     BP Readings from Last 3 Encounters:  08/10/17 136/90  06/14/17 (!) 128/91  05/08/17 122/80   Wt Readings from Last 3 Encounters:  08/10/17 206 lb 3.2 oz (93.5 kg)  05/08/17 204 lb 6.4 oz (92.7 kg)  03/28/17 205 lb (93 kg)    Physical Exam  Constitutional: No  distress.  Cardiovascular: Normal rate, regular rhythm and normal heart sounds.  Pulmonary/Chest: Effort normal and breath sounds normal.  Musculoskeletal: She exhibits no edema.  Neurological: She is alert.  Skin: Skin is warm and dry. She is not diaphoretic.  Psychiatric:  Mood stressed, affect stressed   EKG: Normal sinus rhythm, rate 65, left atrial enlargement, T wave inversion in lead III, no apparent ischemic changes  Assessment/Plan: Please see individual problem list.  Hypertension Blood pressure uncontrolled.  We will start her on medication though I want to see what her lab results reveal first.  This will be sent in later today.  DOE (dyspnea on exertion) Potentially this could be related to obesity and deconditioning though could be cardiac related.  Her risk factors include hypertension and being postmenopausal.  EKG done today and is reassuring.  We will check lab work as outlined below.  If lab work is unremarkable will refer to cardiology.  Given return precautions.  Anxiety and depression Patient with anxiety and depression as well as stress related to her husband's medical conditions and her schooling.  Discussed possible treatment with medication or therapy and she would like to defer these things as she is going to have a break from school and is going to try to  get herself together over the summer.  If she is not improving with that she will let us know.  Given return precautions.  Dysphagia I encouraged her to complete follow-up with GI for this.  Bilateral carpal tunnel syndrome Symptoms have resolved.  She will monitor for recurrence.    Orders Placed This Encounter  Procedures  . Comp Met (CMET)  . CBC  . TSH  . Lipid panel  . HgB A1c  . B Nat Peptide  . EKG 12-Lead    No orders of the defined types were placed in this encounter.    Tammy Rumps, MD [place]

## 2017-08-10 NOTE — Assessment & Plan Note (Signed)
Potentially this could be related to obesity and deconditioning though could be cardiac related.  Her risk factors include hypertension and being postmenopausal.  EKG done today and is reassuring.  We will check lab work as outlined below.  If lab work is unremarkable will refer to cardiology.  Given return precautions.

## 2017-08-10 NOTE — Assessment & Plan Note (Signed)
Patient with anxiety and depression as well as stress related to her husband's medical conditions and her schooling.  Discussed possible treatment with medication or therapy and she would like to defer these things as she is going to have a break from school and is going to try to get herself together over the summer.  If she is not improving with that she will let us know.  Given return precautions.

## 2017-08-10 NOTE — Assessment & Plan Note (Signed)
Symptoms have resolved.  She will monitor for recurrence. 

## 2017-08-10 NOTE — Assessment & Plan Note (Signed)
I encouraged her to complete follow-up with GI for this.

## 2017-08-10 NOTE — Assessment & Plan Note (Signed)
Blood pressure uncontrolled.  We will start her on medication though I want to see what her lab results reveal first.  This will be sent in later today.

## 2017-08-30 ENCOUNTER — Ambulatory Visit (INDEPENDENT_AMBULATORY_CARE_PROVIDER_SITE_OTHER): Payer: Federal, State, Local not specified - PPO | Admitting: *Deleted

## 2017-08-30 VITALS — BP 124/84 | HR 72 | Resp 18

## 2017-08-30 DIAGNOSIS — I10 Essential (primary) hypertension: Secondary | ICD-10-CM | POA: Diagnosis not present

## 2017-08-30 NOTE — Progress Notes (Signed)
Patient in for 2 week follow up after starting amlodipine 5 mg , BP left arm 124/80 pulse 72, right arm 124/84 pulse 72, patient confirmed taking BP medication daily.

## 2017-08-30 NOTE — Progress Notes (Signed)
The patient's blood pressure is adequately controlled.  She should continue with amlodipine.  She should have follow-up with me in 3 to 4 months.

## 2017-08-31 DIAGNOSIS — I10 Essential (primary) hypertension: Secondary | ICD-10-CM | POA: Diagnosis not present

## 2017-08-31 DIAGNOSIS — Z713 Dietary counseling and surveillance: Secondary | ICD-10-CM | POA: Diagnosis not present

## 2017-08-31 DIAGNOSIS — E6609 Other obesity due to excess calories: Secondary | ICD-10-CM | POA: Diagnosis not present

## 2017-08-31 DIAGNOSIS — Z6836 Body mass index (BMI) 36.0-36.9, adult: Secondary | ICD-10-CM | POA: Diagnosis not present

## 2017-09-03 NOTE — Progress Notes (Signed)
Patient notified and voiced understanding.

## 2017-09-10 DIAGNOSIS — Z6836 Body mass index (BMI) 36.0-36.9, adult: Secondary | ICD-10-CM | POA: Diagnosis not present

## 2017-09-10 DIAGNOSIS — I10 Essential (primary) hypertension: Secondary | ICD-10-CM | POA: Diagnosis not present

## 2017-09-10 DIAGNOSIS — E6609 Other obesity due to excess calories: Secondary | ICD-10-CM | POA: Diagnosis not present

## 2017-09-10 DIAGNOSIS — Z713 Dietary counseling and surveillance: Secondary | ICD-10-CM | POA: Diagnosis not present

## 2017-09-17 DIAGNOSIS — Z713 Dietary counseling and surveillance: Secondary | ICD-10-CM | POA: Diagnosis not present

## 2017-09-17 DIAGNOSIS — E6609 Other obesity due to excess calories: Secondary | ICD-10-CM | POA: Diagnosis not present

## 2017-09-17 DIAGNOSIS — Z6835 Body mass index (BMI) 35.0-35.9, adult: Secondary | ICD-10-CM | POA: Diagnosis not present

## 2017-09-17 DIAGNOSIS — I10 Essential (primary) hypertension: Secondary | ICD-10-CM | POA: Diagnosis not present

## 2017-09-18 DIAGNOSIS — N9089 Other specified noninflammatory disorders of vulva and perineum: Secondary | ICD-10-CM | POA: Diagnosis not present

## 2017-10-01 DIAGNOSIS — Z6835 Body mass index (BMI) 35.0-35.9, adult: Secondary | ICD-10-CM | POA: Diagnosis not present

## 2017-10-01 DIAGNOSIS — E6609 Other obesity due to excess calories: Secondary | ICD-10-CM | POA: Diagnosis not present

## 2017-10-01 DIAGNOSIS — Z713 Dietary counseling and surveillance: Secondary | ICD-10-CM | POA: Diagnosis not present

## 2017-10-01 DIAGNOSIS — I10 Essential (primary) hypertension: Secondary | ICD-10-CM | POA: Diagnosis not present

## 2017-10-08 DIAGNOSIS — I10 Essential (primary) hypertension: Secondary | ICD-10-CM | POA: Diagnosis not present

## 2017-10-08 DIAGNOSIS — E6609 Other obesity due to excess calories: Secondary | ICD-10-CM | POA: Diagnosis not present

## 2017-10-08 DIAGNOSIS — Z6834 Body mass index (BMI) 34.0-34.9, adult: Secondary | ICD-10-CM | POA: Diagnosis not present

## 2017-10-08 DIAGNOSIS — Z713 Dietary counseling and surveillance: Secondary | ICD-10-CM | POA: Diagnosis not present

## 2017-10-14 NOTE — Progress Notes (Signed)
Cardiology Office Note  Date:  10/16/2017   ID:  Tammy Gregory, DOB March 25, 1960, MRN [id number]  PCP:  Leone Haven, MD   Chief Complaint  Patient presents with  . other    SOB. Refused EKG cost/insurance reasons. Meds reviewed verbally with pt.    HPI:  Tammy Gregory is a pleasant 57 year old woman with past medical history of HTN Obesity Anxiety/depression dysphagia Referred by Dr. Caryl Bis for consultation of her SOB  She reported worsening shortness of breath over the past year With exercise was getting very short of breath Felt it might be secondary to weight gain The prior smoking history Denies any family history of heart disease No PND orthopnea  She does report some anxiety concerning husband's health issues and her father who has health issues and lives in [place] Periodically has tofly back to [place] to help him  Since her referral to our clinic has had a dramatic change in her diet and exercise regimen Stopped all sugar Started exercise x 2 weeks, elipical, rowing, getting better Down 12 pounds Shortness of breath dramatically improved  Previous EKG reviewed with her showing normal sinus rhythm no significant ST or T-wave changes   PMH:   has a past medical history of Chicken pox, Headache, and Hypertension.  PSH:    Past Surgical History:  Procedure Laterality Date  . CERVICAL FUSION  12/03/2014  . CESAREAN SECTION     3  . COLONOSCOPY WITH PROPOFOL N/A 04/02/2015   Procedure: COLONOSCOPY WITH PROPOFOL;  Surgeon: Lollie Sails, MD;  Location: [facility] ENDOSCOPY;  Service: Endoscopy;  Laterality: N/A;    Current Outpatient Medications  Medication Sig Dispense Refill  . amLODipine (NORVASC) 5 MG tablet Take 1 tablet (5 mg total) by mouth daily. 90 tablet 3   No current facility-administered medications for this visit.      Allergies:   Codeine; Amoxicillin; and Clindamycin   Social History:  The patient  reports  that she has never smoked. She has never used smokeless tobacco. She reports that she drinks alcohol. She reports that she does not use drugs.   Family History:   family history includes Hypertension in her father and mother.    Review of Systems: Review of Systems  Constitutional: Negative.   Respiratory: Positive for shortness of breath.   Cardiovascular: Negative.   Gastrointestinal: Negative.   Musculoskeletal: Negative.   Neurological: Negative.   Psychiatric/Behavioral: Negative.   All other systems reviewed and are negative.    PHYSICAL EXAM: VS:  BP 134/84 (BP Location: Right Arm, Patient Position: Sitting, Cuff Size: Normal)   Pulse 68   Ht 5\' 2"  (1.575 m)   Wt 194 lb 8 oz (88.2 kg)   LMP 04/02/2007 (Approximate)   BMI 35.57 kg/m  , BMI Body mass index is 35.57 kg/m. GEN: Well nourished, well developed, in no acute distress , obese HEENT: normal  Neck: no JVD, carotid bruits, or masses Cardiac: RRR; no murmurs, rubs, or gallops,no edema  Respiratory:  clear to auscultation bilaterally, normal work of breathing GI: soft, nontender, nondistended, + BS MS: no deformity or atrophy  Skin: warm and dry, no rash Neuro:  Strength and sensation are intact Psych: euthymic mood, full affect   Recent Labs: 08/10/2017: ALT 13; BUN 18; Creatinine, Ser 0.83; Hemoglobin 14.2; Platelets 185.0; Potassium 4.3; Pro B Natriuretic peptide (BNP) 23.0; Sodium 142; TSH 1.93    Lipid Panel Lab Results  Component Value Date  CHOL 170 08/10/2017   HDL 51.00 08/10/2017   LDLCALC 96 08/10/2017   TRIG 115.0 08/10/2017      Wt Readings from Last 3 Encounters:  10/16/17 194 lb 8 oz (88.2 kg)  08/10/17 206 lb 3.2 oz (93.5 kg)  05/08/17 204 lb 6.4 oz (92.7 kg)       ASSESSMENT AND PLAN:  DOE (dyspnea on exertion) Likely secondary to obesity and deconditioning Symptoms improved and feels back to her baseline with 12 pound weight loss, regular exercise program She has dropped all  of her sugar from her diet, overall feeling much better Normal EKG and clinical exam Essential hypertension  Leg edema Edema should improve with weight loss Also recommended she try to hold the amlodipine and monitor blood pressurs at home Wear compression hose for likely component of dependent edema  Obesity (BMI 35.0-39.9 without comorbidity) Dramatic improvement in her weight down 12 pounds with dietary changes and exercise  Pure hypercholesterolemia Well-controlled numbers on no medication  Disposition:   F/U  As needed   Total encounter time more than 60 minutes  Greater than 50% was spent in counseling and coordination of care with the patient  Patient seen in consultation for Dr. Caryl Bis and will be referred back to his office for ongoing care of issues T tell above  No orders of the defined types were placed in this encounter.    Signed, Esmond Plants, M.D., Ph.D. 10/16/2017  [place], [place]

## 2017-10-16 ENCOUNTER — Ambulatory Visit: Payer: Federal, State, Local not specified - PPO | Admitting: Cardiovascular Disease

## 2017-10-16 ENCOUNTER — Encounter: Payer: Self-pay | Admitting: Cardiovascular Disease

## 2017-10-16 VITALS — BP 134/84 | HR 68 | Ht 62.0 in | Wt 194.5 lb

## 2017-10-16 DIAGNOSIS — E669 Obesity, unspecified: Secondary | ICD-10-CM

## 2017-10-16 DIAGNOSIS — I10 Essential (primary) hypertension: Secondary | ICD-10-CM | POA: Diagnosis not present

## 2017-10-16 DIAGNOSIS — R600 Localized edema: Secondary | ICD-10-CM

## 2017-10-16 DIAGNOSIS — E7800 Pure hypercholesterolemia, unspecified: Secondary | ICD-10-CM

## 2017-10-16 DIAGNOSIS — R0609 Other forms of dyspnea: Secondary | ICD-10-CM | POA: Diagnosis not present

## 2017-10-16 DIAGNOSIS — R0600 Dyspnea, unspecified: Secondary | ICD-10-CM

## 2017-10-16 NOTE — Patient Instructions (Addendum)

## 2017-10-18 DIAGNOSIS — E6609 Other obesity due to excess calories: Secondary | ICD-10-CM | POA: Diagnosis not present

## 2017-10-18 DIAGNOSIS — Z6834 Body mass index (BMI) 34.0-34.9, adult: Secondary | ICD-10-CM | POA: Diagnosis not present

## 2017-10-18 DIAGNOSIS — Z713 Dietary counseling and surveillance: Secondary | ICD-10-CM | POA: Diagnosis not present

## 2017-10-18 DIAGNOSIS — I10 Essential (primary) hypertension: Secondary | ICD-10-CM | POA: Diagnosis not present

## 2017-11-15 DIAGNOSIS — Z6833 Body mass index (BMI) 33.0-33.9, adult: Secondary | ICD-10-CM | POA: Diagnosis not present

## 2017-11-15 DIAGNOSIS — Z713 Dietary counseling and surveillance: Secondary | ICD-10-CM | POA: Diagnosis not present

## 2017-11-15 DIAGNOSIS — G4700 Insomnia, unspecified: Secondary | ICD-10-CM | POA: Diagnosis not present

## 2017-11-15 DIAGNOSIS — E6609 Other obesity due to excess calories: Secondary | ICD-10-CM | POA: Diagnosis not present

## 2017-11-30 ENCOUNTER — Ambulatory Visit (INDEPENDENT_AMBULATORY_CARE_PROVIDER_SITE_OTHER): Payer: Federal, State, Local not specified - PPO | Admitting: Family Medicine

## 2017-11-30 ENCOUNTER — Encounter: Payer: Self-pay | Admitting: Family Medicine

## 2017-11-30 VITALS — BP 106/80 | HR 76 | Temp 98.0°F | Ht 62.0 in | Wt 189.0 lb

## 2017-11-30 DIAGNOSIS — R1310 Dysphagia, unspecified: Secondary | ICD-10-CM | POA: Diagnosis not present

## 2017-11-30 DIAGNOSIS — R0600 Dyspnea, unspecified: Secondary | ICD-10-CM

## 2017-11-30 DIAGNOSIS — I10 Essential (primary) hypertension: Secondary | ICD-10-CM

## 2017-11-30 DIAGNOSIS — E669 Obesity, unspecified: Secondary | ICD-10-CM

## 2017-11-30 DIAGNOSIS — R0609 Other forms of dyspnea: Secondary | ICD-10-CM

## 2017-11-30 DIAGNOSIS — R499 Unspecified voice and resonance disorder: Secondary | ICD-10-CM | POA: Diagnosis not present

## 2017-11-30 DIAGNOSIS — G5603 Carpal tunnel syndrome, bilateral upper limbs: Secondary | ICD-10-CM | POA: Diagnosis not present

## 2017-11-30 NOTE — Assessment & Plan Note (Signed)
Well controlled. Continue amlodipine 

## 2017-11-30 NOTE — Assessment & Plan Note (Signed)
Resolved. Monitor for recurrence. 

## 2017-11-30 NOTE — Progress Notes (Signed)
  Tommi Rumps, MD Phone: [phone]  Tammy Gregory is a 57 y.o. female who presents today for f/u.  CC: htn, DOE, obesity, carpal tunnel, dysphagia  HYPERTENSION  Disease Monitoring  Home BP Monitoring 120/80 Chest pain- no    Dyspnea- no Medications  Compliance-  Taking amlodipine.  Edema- no  Patient notes her dyspnea on exertion resolved with weight loss.  She went to nutritionist and has drastically altered her diet.  No carbs in the morning.  She has cut out fried food and fast food.  Limited sugar.  No candy.  Not exercising much recently.  Dysphagia: She did see GI.  She did end up having a barium swallow test that did not reveal any abnormalities.  She has had no additional issues with dysphagia. They did recommend ENT evaluation due to voice change.  Does report some chronic voice change.  Carpal tunnel syndrome: Patient notes her symptoms have improved quite a bit.  She notes no significant pain in either hand or wrist.  She does note her right wrist will lock up at times when she does certain movements.  She does wear the braces at times.    Social History   Tobacco Use  Smoking Status Never Smoker  Smokeless Tobacco Never Used     ROS see history of present illness  Objective  Physical Exam Vitals:   11/30/17 0850  BP: 106/80  Pulse: 76  Temp: 98 F (36.7 C)  SpO2: 97%    BP Readings from Last 3 Encounters:  11/30/17 106/80  10/16/17 134/84  08/30/17 124/84   Wt Readings from Last 3 Encounters:  11/30/17 189 lb (85.7 kg)  10/16/17 194 lb 8 oz (88.2 kg)  08/10/17 206 lb 3.2 oz (93.5 kg)    Physical Exam  Constitutional: No distress.  Cardiovascular: Normal rate, regular rhythm and normal heart sounds.  Pulmonary/Chest: Effort normal and breath sounds normal.  Musculoskeletal: She exhibits no edema.  Neurological: She is alert.  Skin: Skin is warm and dry. She is not diaphoretic.     Assessment/Plan: Please see individual  problem list.  Dysphagia Resolved.  Monitor for recurrence.  Hypertension Well-controlled.  Continue amlodipine.  Bilateral carpal tunnel syndrome Improved.  Does have some issues with her right wrist.  Encouraged her to see orthopedics as previously discussed.  DOE (dyspnea on exertion) Resolved with weight loss.  Monitor for recurrence.  Obesity (BMI 30.0-34.9) She will continue to work on diet and exercise.  Change in voice Potentially could be related to her prior cervical fusion though we will refer her to ENT to consider direct visualization.   Orders Placed This Encounter  Procedures  . Ambulatory referral to Orthopedic Surgery    Referral Priority:   Routine    Referral Type:   Surgical    Referral Reason:   Specialty Services Required    Requested Specialty:   Orthopedic Surgery    Number of Visits Requested:   1  . Ambulatory referral to ENT    Referral Priority:   Routine    Referral Type:   Consultation    Referral Reason:   Specialty Services Required    Requested Specialty:   Otolaryngology    Number of Visits Requested:   1    No orders of the defined types were placed in this encounter.    Tommi Rumps, MD [place]

## 2017-11-30 NOTE — Assessment & Plan Note (Signed)
Potentially could be related to her prior cervical fusion though we will refer her to ENT to consider direct visualization.

## 2017-11-30 NOTE — Patient Instructions (Addendum)
Nice to see you. I will place another referral to orthopedics. Please continue to work on dietary changes and exercise.  If your trouble swallowing returns please let us know right away.

## 2017-11-30 NOTE — Assessment & Plan Note (Signed)
She will continue to work on diet and exercise. 

## 2017-11-30 NOTE — Assessment & Plan Note (Signed)
Resolved with weight loss.  Monitor for recurrence.

## 2017-11-30 NOTE — Assessment & Plan Note (Signed)
Improved.  Does have some issues with her right wrist.  Encouraged her to see orthopedics as previously discussed.

## 2017-12-17 DIAGNOSIS — R1310 Dysphagia, unspecified: Secondary | ICD-10-CM | POA: Diagnosis not present

## 2017-12-17 DIAGNOSIS — R490 Dysphonia: Secondary | ICD-10-CM | POA: Diagnosis not present

## 2017-12-17 DIAGNOSIS — K219 Gastro-esophageal reflux disease without esophagitis: Secondary | ICD-10-CM | POA: Diagnosis not present

## 2017-12-21 DIAGNOSIS — G5603 Carpal tunnel syndrome, bilateral upper limbs: Secondary | ICD-10-CM | POA: Diagnosis not present

## 2018-01-09 DIAGNOSIS — G5603 Carpal tunnel syndrome, bilateral upper limbs: Secondary | ICD-10-CM | POA: Diagnosis not present

## 2018-01-14 DIAGNOSIS — H02831 Dermatochalasis of right upper eyelid: Secondary | ICD-10-CM | POA: Diagnosis not present

## 2018-01-14 DIAGNOSIS — H02832 Dermatochalasis of right lower eyelid: Secondary | ICD-10-CM | POA: Diagnosis not present

## 2018-01-14 DIAGNOSIS — H02835 Dermatochalasis of left lower eyelid: Secondary | ICD-10-CM | POA: Diagnosis not present

## 2018-01-14 DIAGNOSIS — H02834 Dermatochalasis of left upper eyelid: Secondary | ICD-10-CM | POA: Diagnosis not present

## 2018-01-25 DIAGNOSIS — G5603 Carpal tunnel syndrome, bilateral upper limbs: Secondary | ICD-10-CM | POA: Diagnosis not present

## 2018-02-22 ENCOUNTER — Encounter: Payer: Self-pay | Admitting: Family Medicine

## 2018-02-22 ENCOUNTER — Ambulatory Visit: Payer: Federal, State, Local not specified - PPO | Admitting: Family Medicine

## 2018-02-22 VITALS — BP 110/76 | HR 74 | Temp 98.2°F | Ht 62.0 in | Wt 191.8 lb

## 2018-02-22 DIAGNOSIS — H9202 Otalgia, left ear: Secondary | ICD-10-CM | POA: Diagnosis not present

## 2018-02-22 DIAGNOSIS — H6593 Unspecified nonsuppurative otitis media, bilateral: Secondary | ICD-10-CM | POA: Diagnosis not present

## 2018-02-22 NOTE — Progress Notes (Signed)
Subjective:    Patient ID: Tammy Gregory, female    DOB: 1960-04-04, 57 y.o.   MRN: [id number]  HPI  Presents to clinic c/o left ear pain for 2-3 days. States when she woke up this AM her ear pain was much better and is only a little sore.  States yesterday she took a dose of 1000 mg tylenol and about an hour later took 800 mg of ibuprofen.  Denies fever or chills. Does have some nasal congestion. No sinus pain. No SOB, cough or wheeze.  Patient Active Problem List   Diagnosis Date Noted  . Change in voice 11/30/2017  . DOE (dyspnea on exertion) 08/10/2017  . Fatty liver 05/10/2017  . Leg edema 05/10/2017  . Bilateral carpal tunnel syndrome 05/10/2017  . Dysphagia 05/10/2017  . Acute bilateral low back pain with bilateral sciatica 10/10/2016  . Insomnia 02/28/2016  . Pure hypercholesterolemia 02/24/2016  . Anxiety and depression 07/23/2015  . Obesity (BMI 30.0-34.9) 12/28/2014  . Hypertension 12/28/2014  . S/P cervical spinal fusion 12/28/2014  . Family history of breast cancer 10/11/2011   Social History   Tobacco Use  . Smoking status: Never Smoker  . Smokeless tobacco: Never Used  Substance Use Topics  . Alcohol use: Yes    Comment: occ   Review of Systems  Constitutional: Negative for chills, fatigue and fever.  HENT: +left ear pain, some nasal congestion Eyes: Negative.   Respiratory: Negative for cough, shortness of breath and wheezing.   Cardiovascular: Negative for chest pain, palpitations and leg swelling.  Gastrointestinal: Negative for abdominal pain, diarrhea, nausea and vomiting.  Genitourinary: Negative for dysuria, frequency and urgency.  Musculoskeletal: Negative for arthralgias and myalgias.  Skin: Negative for color change, pallor and rash.  Neurological: Negative for syncope, light-headedness and headaches.  Psychiatric/Behavioral: The patient is not nervous/anxious.       Objective:   Physical Exam Vitals signs and nursing note  reviewed.  Constitutional:      General: She is not in acute distress.    Appearance: Normal appearance. She is not toxic-appearing.  HENT:     Head: Normocephalic and atraumatic.     Right Ear: External ear normal. A middle ear effusion (small, TM light pink) is present. There is no impacted cerumen.     Left Ear: External ear normal. A middle ear effusion (small, TM light pink) is present. There is no impacted cerumen.     Nose: Rhinorrhea present.     Mouth/Throat:     Mouth: Mucous membranes are moist.  Eyes:     General: No scleral icterus.    Extraocular Movements: Extraocular movements intact.     Conjunctiva/sclera: Conjunctivae normal.  Neck:     Musculoskeletal: Normal range of motion and neck supple. No neck rigidity.  Cardiovascular:     Rate and Rhythm: Normal rate and regular rhythm.  Pulmonary:     Effort: Pulmonary effort is normal. No respiratory distress.     Breath sounds: Normal breath sounds. No wheezing.  Lymphadenopathy:     Cervical: No cervical adenopathy.  Skin:    Coloration: Skin is not jaundiced or pale.  Neurological:     Mental Status: She is alert and oriented to person, place, and time.  Psychiatric:        Mood and Affect: Mood normal.        Behavior: Behavior normal.       Vitals:   02/22/18 0827  BP: 110/76  Pulse: 74  Temp: 98.2 F (36.8 C)  SpO2: 93%   Assessment & Plan:   Otalgia left ear, middle ear effusion bilateral, nasal congestion - patient advised she can take Claritin-D for 2 or 3 days, then after that she can take plain Claritin to help reduce ear congestion and nasal congestion - offered Rx but she states she will buy OTC. Patient also advised she can use Tylenol or albuterol as needed for any pain.  Encouraged to increase fluid intake, do good handwashing, get plenty of rest.  Patient advised to keep regularly scheduled follow-up PCP as planned.  She will return to clinic sooner if any issues arise.

## 2018-02-22 NOTE — Patient Instructions (Signed)
You can take Claritin D (claritin plus sudafed) for a 2-3 days and after this you can take plain Claritin 10 mg per day  Generic of Claritin is loratadine  Also you can use tylenol or ibuprofen as needed for pain

## 2018-03-05 DIAGNOSIS — G4700 Insomnia, unspecified: Secondary | ICD-10-CM | POA: Diagnosis not present

## 2018-03-05 DIAGNOSIS — Z713 Dietary counseling and surveillance: Secondary | ICD-10-CM | POA: Diagnosis not present

## 2018-03-05 DIAGNOSIS — Z6833 Body mass index (BMI) 33.0-33.9, adult: Secondary | ICD-10-CM | POA: Diagnosis not present

## 2018-03-05 DIAGNOSIS — E6609 Other obesity due to excess calories: Secondary | ICD-10-CM | POA: Diagnosis not present

## 2018-04-11 DIAGNOSIS — G5603 Carpal tunnel syndrome, bilateral upper limbs: Secondary | ICD-10-CM | POA: Diagnosis not present

## 2018-04-18 ENCOUNTER — Telehealth: Payer: Self-pay

## 2018-04-18 NOTE — Telephone Encounter (Signed)
Copied from [place] [id number]. Topic: General - Other >> Apr 18, 2018  3:59 PM Alanda Slim E wrote: Reason for CRM: Pt is having carpal tunnel surgery on 03.06.2020 and Pt needs to complete a CPR training every 3 months at work. Since Pt cant do it because of her hand the Pt's employer needs Dr. Caryl Bis To sign a waiver for [facility] to hold Pt from doing CPR training for 30 days after surgery/ Pt will drop off waiver tomorrow or Monday/ please advise

## 2018-04-19 NOTE — Telephone Encounter (Signed)
Given that the patient is seeing orthopedics for her carpal tunnel syndrome and is planning on having surgery she should contact them to review the form and determine if this is appropriate.  Thanks.

## 2018-04-22 NOTE — Telephone Encounter (Signed)
I spoke with patient & she had not dropped of form. She stated that she no longer needed form signed.

## 2018-05-06 DIAGNOSIS — G5602 Carpal tunnel syndrome, left upper limb: Secondary | ICD-10-CM | POA: Diagnosis not present

## 2018-05-06 DIAGNOSIS — Z01818 Encounter for other preprocedural examination: Secondary | ICD-10-CM | POA: Diagnosis not present

## 2018-05-10 DIAGNOSIS — Z791 Long term (current) use of non-steroidal anti-inflammatories (NSAID): Secondary | ICD-10-CM | POA: Diagnosis not present

## 2018-05-10 DIAGNOSIS — I10 Essential (primary) hypertension: Secondary | ICD-10-CM | POA: Diagnosis not present

## 2018-05-10 DIAGNOSIS — Z981 Arthrodesis status: Secondary | ICD-10-CM | POA: Diagnosis not present

## 2018-05-10 DIAGNOSIS — G5602 Carpal tunnel syndrome, left upper limb: Secondary | ICD-10-CM | POA: Diagnosis not present

## 2018-05-10 DIAGNOSIS — K219 Gastro-esophageal reflux disease without esophagitis: Secondary | ICD-10-CM | POA: Diagnosis not present

## 2018-05-10 DIAGNOSIS — Z79899 Other long term (current) drug therapy: Secondary | ICD-10-CM | POA: Diagnosis not present

## 2018-07-19 ENCOUNTER — Other Ambulatory Visit: Payer: Self-pay

## 2018-07-19 ENCOUNTER — Ambulatory Visit (INDEPENDENT_AMBULATORY_CARE_PROVIDER_SITE_OTHER): Payer: Federal, State, Local not specified - PPO | Admitting: Family Medicine

## 2018-07-19 ENCOUNTER — Encounter: Payer: Self-pay | Admitting: Family Medicine

## 2018-07-19 ENCOUNTER — Telehealth: Payer: Self-pay | Admitting: Family Medicine

## 2018-07-19 DIAGNOSIS — M25561 Pain in right knee: Secondary | ICD-10-CM | POA: Diagnosis not present

## 2018-07-19 DIAGNOSIS — M25562 Pain in left knee: Secondary | ICD-10-CM

## 2018-07-19 DIAGNOSIS — M25552 Pain in left hip: Secondary | ICD-10-CM

## 2018-07-19 NOTE — Telephone Encounter (Signed)
Left message for patient to return call back. PEC may give information.  

## 2018-07-19 NOTE — Telephone Encounter (Signed)
Please contact the patient and get her set up for x-rays to be completed on Tuesday.

## 2018-07-19 NOTE — Assessment & Plan Note (Addendum)
Patient with worsening pain in her left knee, left hip, and right knee after a fall 5 months ago.  She will have x-rays done of these joints.  She will change to taking meloxicam.  She will discontinue ibuprofen.  Discussed taking meloxicam with food.  Mild edema is likely related to her sedentary lifestyle in combination with her prior injuries.

## 2018-07-19 NOTE — Progress Notes (Signed)
Virtual Visit via telephone Note  This visit type was conducted due to national recommendations for restrictions regarding the COVID-19 pandemic (e.g. social distancing).  This format is felt to be most appropriate for this patient at this time.  All issues noted in this document were discussed and addressed.  No physical exam was performed (except for noted visual exam findings with Video Visits).   I connected with Tammy Gregory today at  1:15 PM EDT by a video enabled telemedicine application and then by telephone after the video visit failed halfway through the visit and verified that I am speaking with the correct person using two identifiers. Location patient: home  Location provider: work Persons participating in the virtual visit: patient, provider  I discussed the limitations, risks, security and privacy concerns of performing an evaluation and management service by telephone and the availability of in person appointments. I also discussed with the patient that there may be a patient responsible charge related to this service. The patient expressed understanding and agreed to proceed.  Interactive audio and video telecommunications were attempted between this provider and patient, however failed, due to patient having technical difficulties OR patient did not have access to video capability.  We continued and completed visit with audio only.    Reason for visit: same day visit  HPI: Knee and hip pain: Patient notes she fell in January in the bathtub.  She landed on her left hip.  She did also hit her head.  It happened so fast that she is unsure if she lost consciousness though does not think she did.  She has had no headaches.  She notes the pain in her hip and legs started off okay and then has progressively gotten worse.  She notes since she has been home and been more sedentary and has gotten worse as well.  She describes it as a burning pain.  She notes there is an area behind her  left knee that is tender and if she touches it the pain radiates down her leg and up to her hip.  She has been taking ibuprofen 800 mg in the morning.  She also takes gabapentin at night.  She notes it is hard to bear weight at times.  She does feel as though her knees give out on her and she cannot straighten them totally.  She notes no numbness or weakness.  She does have some bilateral lower extremity edema that improves overnight.   ROS: See pertinent positives and negatives per HPI.  Past Medical History:  Diagnosis Date  . Chicken pox   . Headache   . Hypertension     Past Surgical History:  Procedure Laterality Date  . CERVICAL FUSION  12/03/2014  . CESAREAN SECTION     3  . COLONOSCOPY WITH PROPOFOL N/A 04/02/2015   Procedure: COLONOSCOPY WITH PROPOFOL;  Surgeon: Lollie Sails, MD;  Location: [facility] ENDOSCOPY;  Service: Endoscopy;  Laterality: N/A;    Family History  Problem Relation Age of Onset  . Hypertension Father   . Hypertension Mother     SOCIAL HX: nonsmoker   Current Outpatient Medications:  .  amLODipine (NORVASC) 5 MG tablet, Take 1 tablet (5 mg total) by mouth daily., Disp: 90 tablet, Rfl: 3 .  gabapentin (NEURONTIN) 300 MG capsule, Take 300 mg by mouth 3 (three) times daily., Disp: , Rfl:  .  ibuprofen (ADVIL) 800 MG tablet, Take 800 mg by mouth 3 (three) times daily., Disp: , Rfl:  .  meloxicam (MOBIC) 15 MG tablet, Take 15 mg by mouth daily., Disp: , Rfl:   EXAM: This was converted to a telephone telehealth visit and no physical exam was completed.   ASSESSMENT AND PLAN:  Discussed the following assessment and plan:  Acute pain of left knee - Plan: DG Knee Complete 4 Views Left  Acute pain of right knee - Plan: DG Knee Complete 4 Views Right  Left hip pain - Plan: DG Hip Unilat W OR W/O Pelvis 2-3 Views Left  Acute pain of left knee Patient with worsening pain in her left knee, left hip, and right knee after a fall 5 months ago.  She will have  x-rays done of these joints.  She will change to taking meloxicam.  She will discontinue ibuprofen.  Discussed taking meloxicam with food.  Mild edema is likely related to her sedentary lifestyle in combination with her prior injuries.  CMA will contact patient to get her scheduled for x-rays.   I discussed the assessment and treatment plan with the patient. The patient was provided an opportunity to ask questions and all were answered. The patient agreed with the plan and demonstrated an understanding of the instructions.   The patient was advised to call back or seek an in-person evaluation if the symptoms worsen or if the condition fails to improve as anticipated.  I provided 15 minutes of non-face-to-face time during this encounter.   Tommi Rumps, MD

## 2018-07-22 ENCOUNTER — Telehealth: Payer: Self-pay | Admitting: *Deleted

## 2018-07-22 NOTE — Telephone Encounter (Signed)
Copied from [place] [id number]. Topic: General - Call Back - No Documentation >> Jul 22, 2018 11:32 AM Nils Flack wrote: Reason for CRM: pt is returning missed call from Friday. She thinks it is about xrays.  She will be in with husband tomorrow and would like to do them tomorrow.   Cb is [phone]

## 2018-07-23 ENCOUNTER — Ambulatory Visit (INDEPENDENT_AMBULATORY_CARE_PROVIDER_SITE_OTHER): Payer: Federal, State, Local not specified - PPO

## 2018-07-23 ENCOUNTER — Other Ambulatory Visit: Payer: Self-pay

## 2018-07-23 DIAGNOSIS — M1712 Unilateral primary osteoarthritis, left knee: Secondary | ICD-10-CM | POA: Diagnosis not present

## 2018-07-23 DIAGNOSIS — M25552 Pain in left hip: Secondary | ICD-10-CM

## 2018-07-23 DIAGNOSIS — M1711 Unilateral primary osteoarthritis, right knee: Secondary | ICD-10-CM | POA: Diagnosis not present

## 2018-07-23 DIAGNOSIS — M25562 Pain in left knee: Secondary | ICD-10-CM | POA: Diagnosis not present

## 2018-07-23 DIAGNOSIS — M25561 Pain in right knee: Secondary | ICD-10-CM

## 2018-07-24 NOTE — Telephone Encounter (Signed)
Called pt and left a VM to call back. CRM created and sent to PCP pull. X-rays were done yesterday.

## 2018-08-27 ENCOUNTER — Telehealth: Payer: Self-pay | Admitting: Family Medicine

## 2018-08-27 NOTE — Telephone Encounter (Signed)
Husband mentioned that the patient wanted a refill of her meloxicam that was previously prescribed by orthopedics.  Please contact the patient and see what she would need this refilled for so I can determine if I need to do a visit with her or if I can just refill this.

## 2018-08-29 NOTE — Telephone Encounter (Signed)
Pt states it is for her arthuritis pain she stated she will take whatever you prescribe its just that this worked for her before and she is out. Patient states she is at the airport on her way to [place] for 2 weeks and if you prescibe she will let you know what pharmacy to send it to there.   Leor Whyte,cma

## 2018-08-30 NOTE — Telephone Encounter (Signed)
lmtcb to see which pharmacy she wanted the medication sent to.  Nina,cma

## 2018-08-30 NOTE — Telephone Encounter (Signed)
I am fine prescribing the meloxicam for her. Once she knows where she would like it sent I can send it in.

## 2018-09-10 MED ORDER — MELOXICAM 15 MG PO TABS: 15.0000 mg | ORAL_TABLET | Freq: Every day | ORAL | 0 refills | Status: DC

## 2018-09-10 NOTE — Addendum Note (Signed)
Addended by: Caryl Bis, Bama Hanselman G on: 09/10/2018 10:40 AM   Modules accepted: Orders

## 2018-09-10 NOTE — Telephone Encounter (Signed)
The meloxican needs to be sent to [id number]  on [address].  Nina,cma

## 2018-09-10 NOTE — Telephone Encounter (Signed)
Sent to pharmacy. When taking the meloxicam she should not take any NSAIDs such as ibuprofen or aleve. Thanks.

## 2018-09-10 NOTE — Telephone Encounter (Signed)
Called and informed patient that her Meloxican was sent to the pharmacy and that she cannot take aleve or Ibuprofen while taking it, the patient understood.  Nathifa Ritthaler,cma

## 2018-09-20 ENCOUNTER — Telehealth: Payer: Self-pay

## 2018-09-20 ENCOUNTER — Encounter: Payer: Self-pay | Admitting: Family

## 2018-09-20 ENCOUNTER — Ambulatory Visit: Payer: Federal, State, Local not specified - PPO

## 2018-09-20 ENCOUNTER — Ambulatory Visit: Payer: Self-pay

## 2018-09-20 ENCOUNTER — Ambulatory Visit (INDEPENDENT_AMBULATORY_CARE_PROVIDER_SITE_OTHER): Payer: Federal, State, Local not specified - PPO | Admitting: Family

## 2018-09-20 ENCOUNTER — Other Ambulatory Visit: Payer: Self-pay

## 2018-09-20 VITALS — Ht 62.01 in | Wt 191.8 lb

## 2018-09-20 DIAGNOSIS — R600 Localized edema: Secondary | ICD-10-CM | POA: Diagnosis not present

## 2018-09-20 DIAGNOSIS — M25562 Pain in left knee: Secondary | ICD-10-CM | POA: Diagnosis not present

## 2018-09-20 DIAGNOSIS — M79605 Pain in left leg: Secondary | ICD-10-CM | POA: Diagnosis not present

## 2018-09-20 NOTE — Telephone Encounter (Signed)
Patient called and says since Wednesday she's been having more pain in her left leg, a constant 4-5 pain. She says she's limping when she walks and has to take one step at a time when going up the steps. She says she feels a knot at the bend of her leg on the back and the pain is worse when she stretches the leg out. She says there is no swelling, no redness to the leg. She says she's been taking Meloxicam, rubbing with arthritis cream and nothing is helping the pain. She denies any other symptoms. I called the office and spoke to Badger Lee, [facility] who asks to speak to the patient, the call was connected successfully.   Answer Assessment - Initial Assessment Questions 1. ONSET: "When did the pain start?"      Few days ago 2. LOCATION: "Where is the pain located?"      Left leg 3. PAIN: "How bad is the pain?"    (Scale 1-10; or mild, moderate, severe)   -  MILD (1-3): doesn't interfere with normal activities    -  MODERATE (4-7): interferes with normal activities (e.g., work or school) or awakens from sleep, limping    -  SEVERE (8-10): excruciating pain, unable to do any normal activities, unable to walk     4-5 4. WORK OR EXERCISE: "Has there been any recent work or exercise that involved this part of the body?"      No 5. CAUSE: "What do you think is causing the leg pain?"     I think it's a muscle, but not sure 6. OTHER SYMPTOMS: "Do you have any other symptoms?" (e.g., chest pain, back pain, breathing difficulty, swelling, rash, fever, numbness, weakness)     No 7. PREGNANCY: "Is there any chance you are pregnant?" "When was your last menstrual period?"     No  Protocols used: LEG PAIN-A-AH

## 2018-09-20 NOTE — Telephone Encounter (Signed)
Pt was scheduled a 3:15 appt for ultrasound today at [facility] in [facility], pt said that she could not make it to appt and requested an appt for later than 3:30 pm today.  Checked with scheduler.  No other appts available.  Next available appt on 09/23/18 @ 10:15 am.  Gave pt option of going to ED since there are no available appts left for today.  Educated pt on risk of DVT, signs and symptoms.  Pt voiced understanding with no questions.  Pt decided that she will wait until Monday (09/23/18) for next available appt.   Pt said that she lives 3 minutes from the hospital and will go to ED if she notices symptoms worsen, if leg tenderness increases or if SOB.

## 2018-09-20 NOTE — Patient Instructions (Addendum)
Stat Ultrasound of leg.  Today we discussed referrals, orders. orthopedics   I have placed these orders in the system for you.  Please be sure to give [place] a call if you have not heard from our office regarding this. We should hear from [place] within ONE week with information regarding your appointment. If not, please let me know immediately.   Please let me know how you are doing.

## 2018-09-20 NOTE — Assessment & Plan Note (Signed)
Acute on chronic however patient feels this presentation has changed.  Advised her that her symptoms does support perhaps a Baker's cyst likely in the setting of osteoarthritis.  Discussed consult with orthopedic and patient is agreeable.  Referral placed

## 2018-09-20 NOTE — Progress Notes (Signed)
Patient c/o of left leg pain.  Patient said that her anxiety is "out the roof".  Patient said that she has an appt with an opthalmologist for excessive eye blinking.  Pt traveled to [place] 6/24-7/10.  Pt said she was tested for COVID on 6/24 before she traveled and test was negative.

## 2018-09-20 NOTE — Assessment & Plan Note (Signed)
Appears dependent edema.  No obvious asymmetry in patient's calves.  However in the setting of calf tenderness, and certainly a virtual environment, advised patient to have stat ultrasound done today.  She declines going to ED ( no [place] appointment available today) and felt comfortable with waiting until Monday.  Ultrasound has been scheduled.

## 2018-09-20 NOTE — Progress Notes (Signed)
This visit type was conducted due to national recommendations for restrictions regarding the COVID-19 pandemic (e.g. social distancing).  This format is felt to be most appropriate for this patient at this time.  All issues noted in this document were discussed and addressed.  No physical exam was performed (except for noted visual exam findings with Video Visits). Virtual Visit via Video Note  I connected with@  on 09/20/18 at  1:15 PM EDT by a video enabled telemedicine application and verified that I am speaking with the correct person using two identifiers.  Location patient: home Location provider:work  Persons participating in the virtual visit: patient, provider  I discussed the limitations of evaluation and management by telemedicine and the availability of in person appointments. The patient expressed understanding and agreed to proceed.   HPI:  CC: leg pain, which feels 'new' x 3 days, constant.   Feels pain if stretch or bends . If sits for long period, hard to get to get going and stiff.  Pain improves with rest however still there.    Flew 7 months ago to [place]  and thought it was from airplane; wore compression stockings for period however felt too tight.   Limping when walks, takes one step at a time. Can feel posterior 'knot' first noticed prior weeks ago however had not been painful; now painful when bends behind knee cap. Pain when presses on knot. Knot is not red, nor has increased heat.  No purulent discharge  NO asymmetry of calves, redness, numbness, fever , sob, rash.   Couple of days ago noted ankles and calves were swollen. Left more so than right. 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 Swelling resolved today. Worse at the end of day.   No h/o dvt.   No relief with mobic, muscle salve, brace, icing without relief.   Saw pcp 2 months ago for left knee pain.  Fall in January 2020.   07/2018: X-ray right knee shows moderate degenerative  joint disease. 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 Not going to the gym like had in the past.   Plans to start exercise more.   ROS: See pertinent positives and negatives per HPI.  Past Medical History:  Diagnosis Date  . Chicken pox   . Headache   . Hypertension     Past Surgical History:  Procedure Laterality Date  . CERVICAL FUSION  12/03/2014  . CESAREAN SECTION     3  . COLONOSCOPY WITH PROPOFOL N/A 04/02/2015   Procedure: COLONOSCOPY WITH PROPOFOL;  Surgeon: Lollie Sails, MD;  Location: [facility] ENDOSCOPY;  Service: Endoscopy;  Laterality: N/A;    Family History  Problem Relation Age of Onset  . Hypertension Father   . Hypertension Mother     SOCIAL HX: never smoker   Current Outpatient Medications:  .  amLODipine (NORVASC) 5 MG tablet, Take 1 tablet (5 mg total) by mouth daily., Disp: 90 tablet, Rfl: 3 .  gabapentin (NEURONTIN) 300 MG capsule, Take 300 mg by mouth 3 (three) times daily., Disp: , Rfl:  .  meloxicam (MOBIC) 15 MG tablet, Take 1 tablet (15 mg total) by mouth daily., Disp: 30 tablet, Rfl: 0 .  OMEPRAZOLE PO, Take by mouth., Disp: , Rfl:   EXAM:  VITALS per patient if applicable:  GENERAL: alert, oriented, appears well and in no acute distress  HEENT: atraumatic, conjunttiva clear,  no obvious abnormalities on inspection of external nose and ears  NECK: normal movements of the head and neck  LUNGS: on inspection no signs of respiratory distress, breathing rate appears normal, no obvious gross SOB, gasping or wheezing  CV: no obvious cyanosis  MS: moves all visible extremities without noticeable abnormality.  Able to appreciate small enlarged area  Left posterior knee.  Does not appear fluctuant, erythematous.  PSYCH/NEURO: pleasant and cooperative, no obvious depression or anxiety, speech and thought processing grossly intact  ASSESSMENT AND PLAN:  Discussed  the following assessment and plan:  Problem List Items Addressed This Visit      Other   Leg edema    Appears dependent edema.  No obvious asymmetry in patient's calves.  However in the setting of calf tenderness, and certainly a virtual environment, advised patient to have stat ultrasound done today.  She declines going to ED ( no [place] appointment available today) and felt comfortable with waiting until Monday.  Ultrasound has been scheduled.      Left knee pain    Acute on chronic however patient feels this presentation has changed.  Advised her that her symptoms does support perhaps a Baker's cyst likely in the setting of osteoarthritis.  Discussed consult with orthopedic and patient is agreeable.  Referral placed       Other Visit Diagnoses    Pain of left lower extremity    -  Primary   Relevant Orders   Ambulatory referral to Orthopedic Surgery   Bilateral leg edema       Relevant Orders   Ambulatory referral to Orthopedic Surgery   US Venous Img Lower Bilateral        I discussed the assessment and treatment plan with the patient. The patient was provided an opportunity to ask questions and all were answered. The patient agreed with the plan and demonstrated an understanding of the instructions.   The patient was advised to call back or seek an in-person evaluation if the symptoms worsen or if the condition fails to improve as anticipated.   Mable Paris, FNP

## 2018-09-20 NOTE — Telephone Encounter (Signed)
Appears to be scheduled with Joycelyn Schmid this afternoon.

## 2018-09-23 ENCOUNTER — Ambulatory Visit
Admission: RE | Admit: 2018-09-23 | Discharge: 2018-09-23 | Disposition: A | Discharge status: Home / Self Care | Payer: Federal, State, Local not specified - PPO | Source: Ambulatory Visit | Attending: Family | Admitting: Family

## 2018-09-23 ENCOUNTER — Other Ambulatory Visit: Payer: Self-pay

## 2018-09-23 ENCOUNTER — Telehealth: Payer: Self-pay | Admitting: Family Medicine

## 2018-09-23 DIAGNOSIS — R600 Localized edema: Secondary | ICD-10-CM | POA: Insufficient documentation

## 2018-09-23 DIAGNOSIS — M7989 Other specified soft tissue disorders: Secondary | ICD-10-CM | POA: Diagnosis not present

## 2018-09-23 NOTE — Telephone Encounter (Signed)
Bilateral leg dopplers Negative for DVT. Called from [place] department from [place]. Results in chart.

## 2018-09-25 NOTE — Telephone Encounter (Signed)
Pt has an appointment with Dr. Valetta Close at [facility] tomorrow (09/26/2018) and they are requesting a copy of these results.   Pt was told that she needs to pick up a CD to give to them.  Pt would like a call when she can pick this up. Pt can be reached at [phone].

## 2018-09-25 NOTE — Telephone Encounter (Signed)
CD has been placed up front & pt aware.

## 2018-09-26 DIAGNOSIS — H2513 Age-related nuclear cataract, bilateral: Secondary | ICD-10-CM | POA: Diagnosis not present

## 2018-09-26 DIAGNOSIS — M1712 Unilateral primary osteoarthritis, left knee: Secondary | ICD-10-CM | POA: Diagnosis not present

## 2018-09-27 ENCOUNTER — Other Ambulatory Visit: Payer: Self-pay | Admitting: Family Medicine

## 2018-10-09 ENCOUNTER — Other Ambulatory Visit: Payer: Self-pay | Admitting: Family Medicine

## 2018-10-09 ENCOUNTER — Encounter: Payer: Self-pay | Admitting: Family Medicine

## 2018-10-09 MED ORDER — AMLODIPINE BESYLATE 5 MG PO TABS: 5.0000 mg | ORAL_TABLET | Freq: Every day | ORAL | 3 refills | Status: DC

## 2018-10-10 MED ORDER — MELOXICAM 15 MG PO TABS: 15.0000 mg | ORAL_TABLET | Freq: Every day | ORAL | 2 refills | Status: DC

## 2018-10-22 DIAGNOSIS — G5601 Carpal tunnel syndrome, right upper limb: Secondary | ICD-10-CM | POA: Diagnosis not present

## 2018-10-25 DIAGNOSIS — Z713 Dietary counseling and surveillance: Secondary | ICD-10-CM | POA: Diagnosis not present

## 2018-10-25 DIAGNOSIS — I10 Essential (primary) hypertension: Secondary | ICD-10-CM | POA: Diagnosis not present

## 2018-10-25 DIAGNOSIS — E6609 Other obesity due to excess calories: Secondary | ICD-10-CM | POA: Diagnosis not present

## 2018-10-25 DIAGNOSIS — Z6836 Body mass index (BMI) 36.0-36.9, adult: Secondary | ICD-10-CM | POA: Diagnosis not present

## 2018-11-06 DIAGNOSIS — E6609 Other obesity due to excess calories: Secondary | ICD-10-CM | POA: Diagnosis not present

## 2018-11-06 DIAGNOSIS — Z6835 Body mass index (BMI) 35.0-35.9, adult: Secondary | ICD-10-CM | POA: Diagnosis not present

## 2018-11-06 DIAGNOSIS — Z713 Dietary counseling and surveillance: Secondary | ICD-10-CM | POA: Diagnosis not present

## 2018-11-06 DIAGNOSIS — I10 Essential (primary) hypertension: Secondary | ICD-10-CM | POA: Diagnosis not present

## 2018-11-13 DIAGNOSIS — I10 Essential (primary) hypertension: Secondary | ICD-10-CM | POA: Diagnosis not present

## 2018-11-13 DIAGNOSIS — Z6835 Body mass index (BMI) 35.0-35.9, adult: Secondary | ICD-10-CM | POA: Diagnosis not present

## 2018-11-13 DIAGNOSIS — Z713 Dietary counseling and surveillance: Secondary | ICD-10-CM | POA: Diagnosis not present

## 2018-11-13 DIAGNOSIS — E6609 Other obesity due to excess calories: Secondary | ICD-10-CM | POA: Diagnosis not present

## 2018-11-20 DIAGNOSIS — Z713 Dietary counseling and surveillance: Secondary | ICD-10-CM | POA: Diagnosis not present

## 2018-11-20 DIAGNOSIS — Z6834 Body mass index (BMI) 34.0-34.9, adult: Secondary | ICD-10-CM | POA: Diagnosis not present

## 2018-11-20 DIAGNOSIS — I10 Essential (primary) hypertension: Secondary | ICD-10-CM | POA: Diagnosis not present

## 2018-11-20 DIAGNOSIS — E6609 Other obesity due to excess calories: Secondary | ICD-10-CM | POA: Diagnosis not present

## 2018-11-27 ENCOUNTER — Ambulatory Visit (INDEPENDENT_AMBULATORY_CARE_PROVIDER_SITE_OTHER): Payer: Federal, State, Local not specified - PPO | Admitting: Family Medicine

## 2018-11-27 ENCOUNTER — Encounter: Payer: Self-pay | Admitting: Family Medicine

## 2018-11-27 ENCOUNTER — Other Ambulatory Visit: Payer: Self-pay

## 2018-11-27 DIAGNOSIS — F419 Anxiety disorder, unspecified: Secondary | ICD-10-CM | POA: Diagnosis not present

## 2018-11-27 DIAGNOSIS — I10 Essential (primary) hypertension: Secondary | ICD-10-CM

## 2018-11-27 DIAGNOSIS — E66812 Obesity, class 2: Secondary | ICD-10-CM

## 2018-11-27 DIAGNOSIS — F329 Major depressive disorder, single episode, unspecified: Secondary | ICD-10-CM

## 2018-11-27 DIAGNOSIS — F32A Depression, unspecified: Secondary | ICD-10-CM

## 2018-11-27 DIAGNOSIS — Z6835 Body mass index (BMI) 35.0-35.9, adult: Secondary | ICD-10-CM

## 2018-11-27 MED ORDER — ESCITALOPRAM OXALATE 10 MG PO TABS: 10.0000 mg | ORAL_TABLET | Freq: Every day | ORAL | 1 refills | Status: DC

## 2018-11-27 NOTE — Assessment & Plan Note (Signed)
Congratulated on weight loss.  She will continue diet and exercise.

## 2018-11-27 NOTE — Assessment & Plan Note (Signed)
Reports improved control.  Continue amlodipine.

## 2018-11-27 NOTE — Progress Notes (Signed)
Virtual Visit via video Note  This visit type was conducted due to national recommendations for restrictions regarding the COVID-19 pandemic (e.g. social distancing).  This format is felt to be most appropriate for this patient at this time.  All issues noted in this document were discussed and addressed.  No physical exam was performed (except for noted visual exam findings with Video Visits).   I connected with Tammy Gregory today at  9:00 AM EDT by a video enabled telemedicine application and verified that I am speaking with the correct person using two identifiers. Location patient: home Location provider: work Persons participating in the virtual visit: patient, provider  I discussed the limitations, risks, security and privacy concerns of performing an evaluation and management service by telephone and the availability of in person appointments. I also discussed with the patient that there may be a patient responsible charge related to this service. The patient expressed understanding and agreed to proceed.  Reason for visit: follow-up  HPI: Anxiety/depression: Patient notes her anxiety has worsened.  She underestimated her anxiety previously.  She was having some issues with feeling as though she was blinking a lot and seeing floaters and having sparkling in her eyes that she saw an ophthalmologist and everything checked out fine and she realized it was her anxiety that was contributing to this.  She is sleeping poorly.  She has been taking melatonin to help with that.  Her husband's recent surgery and his kidney issues have contributed to this.  She does note some depression though no SI.  She is interested in medication.  Hypertension: Her weight trended up and so did her blood pressure though her weight has decreased and her blood pressure is improved to an acceptable range per patient report.  She is unable to provide numbers today.  Taking amlodipine.  No chest pain, shortness of  breath, or edema.  Obesity: She does note her weight trended up to 204 pounds though she started working with a nutritionist and has been eating very cautiously with less carbs and more protein.  She is doing exercise with riding a bike.  And notes her weight is down to 193 pounds.   ROS: See pertinent positives and negatives per HPI.  Past Medical History:  Diagnosis Date  . Chicken pox   . Headache   . Hypertension     Past Surgical History:  Procedure Laterality Date  . CERVICAL FUSION  12/03/2014  . CESAREAN SECTION     3  . COLONOSCOPY WITH PROPOFOL N/A 04/02/2015   Procedure: COLONOSCOPY WITH PROPOFOL;  Surgeon: Lollie Sails, MD;  Location: [facility] ENDOSCOPY;  Service: Endoscopy;  Laterality: N/A;    Family History  Problem Relation Age of Onset  . Hypertension Father   . Hypertension Mother     SOCIAL HX: Non-smoker.   Current Outpatient Medications:  .  amLODipine (NORVASC) 5 MG tablet, Take 1 tablet (5 mg total) by mouth daily., Disp: 90 tablet, Rfl: 3 .  gabapentin (NEURONTIN) 300 MG capsule, Take 300 mg by mouth 3 (three) times daily., Disp: , Rfl:  .  meloxicam (MOBIC) 15 MG tablet, Take 1 tablet (15 mg total) by mouth daily., Disp: 30 tablet, Rfl: 2 .  OMEPRAZOLE PO, Take by mouth., Disp: , Rfl:  .  escitalopram (LEXAPRO) 10 MG tablet, Take 1 tablet (10 mg total) by mouth daily., Disp: 90 tablet, Rfl: 1  EXAM:  VITALS per patient if applicable: None.  GENERAL: alert, oriented, appears well  and in no acute distress  HEENT: atraumatic, conjunttiva clear, no obvious abnormalities on inspection of external nose and ears  NECK: normal movements of the head and neck  LUNGS: on inspection no signs of respiratory distress, breathing rate appears normal, no obvious gross SOB, gasping or wheezing  CV: no obvious cyanosis  MS: moves all visible extremities without noticeable abnormality  PSYCH/NEURO: pleasant and cooperative, appears anxious, intermittently  tearful, speech and thought processing grossly intact  ASSESSMENT AND PLAN:  Discussed the following assessment and plan:  Hypertension Reports improved control.  Continue amlodipine.  Anxiety and depression These issues have worsened.  Will start on Lexapro.  She will contact us if things worsen further prior to her next appointment in 6 weeks.  Follow-up in 6 weeks.  Obesity Congratulated on weight loss.  She will continue diet and exercise.    I discussed the assessment and treatment plan with the patient. The patient was provided an opportunity to ask questions and all were answered. The patient agreed with the plan and demonstrated an understanding of the instructions.   The patient was advised to call back or seek an in-person evaluation if the symptoms worsen or if the condition fails to improve as anticipated.   Tommi Rumps, MD

## 2018-11-27 NOTE — Assessment & Plan Note (Signed)
These issues have worsened.  Will start on Lexapro.  She will contact us if things worsen further prior to her next appointment in 6 weeks.  Follow-up in 6 weeks.

## 2018-12-11 DIAGNOSIS — E6609 Other obesity due to excess calories: Secondary | ICD-10-CM | POA: Diagnosis not present

## 2018-12-11 DIAGNOSIS — I10 Essential (primary) hypertension: Secondary | ICD-10-CM | POA: Diagnosis not present

## 2018-12-11 DIAGNOSIS — Z6834 Body mass index (BMI) 34.0-34.9, adult: Secondary | ICD-10-CM | POA: Diagnosis not present

## 2018-12-11 DIAGNOSIS — Z713 Dietary counseling and surveillance: Secondary | ICD-10-CM | POA: Diagnosis not present

## 2018-12-17 DIAGNOSIS — Z6834 Body mass index (BMI) 34.0-34.9, adult: Secondary | ICD-10-CM | POA: Diagnosis not present

## 2018-12-17 DIAGNOSIS — Z713 Dietary counseling and surveillance: Secondary | ICD-10-CM | POA: Diagnosis not present

## 2018-12-17 DIAGNOSIS — I10 Essential (primary) hypertension: Secondary | ICD-10-CM | POA: Diagnosis not present

## 2018-12-17 DIAGNOSIS — E6609 Other obesity due to excess calories: Secondary | ICD-10-CM | POA: Diagnosis not present

## 2019-01-10 ENCOUNTER — Encounter: Payer: Self-pay | Admitting: Family Medicine

## 2019-01-10 ENCOUNTER — Ambulatory Visit (INDEPENDENT_AMBULATORY_CARE_PROVIDER_SITE_OTHER): Payer: Federal, State, Local not specified - PPO | Admitting: Family Medicine

## 2019-01-10 ENCOUNTER — Other Ambulatory Visit: Payer: Self-pay

## 2019-01-10 DIAGNOSIS — F329 Major depressive disorder, single episode, unspecified: Secondary | ICD-10-CM

## 2019-01-10 DIAGNOSIS — F419 Anxiety disorder, unspecified: Secondary | ICD-10-CM | POA: Diagnosis not present

## 2019-01-10 DIAGNOSIS — F32A Depression, unspecified: Secondary | ICD-10-CM

## 2019-01-10 NOTE — Assessment & Plan Note (Signed)
Much improved.  She will continue her current regimen.  Follow-up in 3 months.

## 2019-01-10 NOTE — Progress Notes (Signed)
Virtual Visit via video Note  This visit type was conducted due to national recommendations for restrictions regarding the COVID-19 pandemic (e.g. social distancing).  This format is felt to be most appropriate for this patient at this time.  All issues noted in this document were discussed and addressed.  No physical exam was performed (except for noted visual exam findings with Video Visits).   I connected with Tammy Gregory today at 11:30 AM EST by telephone and verified that I am speaking with the correct person using two identifiers. Location patient: home Location provider: work  Persons participating in the virtual visit: patient, provider  I discussed the limitations, risks, security and privacy concerns of performing an evaluation and management service by telephone and the availability of in person appointments. I also discussed with the patient that there may be a patient responsible charge related to this service. The patient expressed understanding and agreed to proceed.  Interactive audio and video telecommunications were attempted between this provider and patient, however failed, due to patient having technical difficulties OR patient did not have access to video capability.  We continued and completed visit with audio only.   Reason for visit: follow-up  HPI: Anxiety/depression: Patient notes this is quite a bit better with the Lexapro.  She notes she had quite a bit of nausea initially with starting it so she cut the dose back to 5 mg for a couple of weeks and then increased back to 10 mg once the nausea improved.  She had a little bit of nausea after that though it has completely resolved at this time.  She changed the Lexapro to nighttime and that has helped her rest more easily.  She notes she is more motivated to do things she was enjoying previously.  No SI.   ROS: See pertinent positives and negatives per HPI.  Past Medical History:  Diagnosis Date  . Chicken  pox   . Headache   . Hypertension     Past Surgical History:  Procedure Laterality Date  . CERVICAL FUSION  12/03/2014  . CESAREAN SECTION     3  . COLONOSCOPY WITH PROPOFOL N/A 04/02/2015   Procedure: COLONOSCOPY WITH PROPOFOL;  Surgeon: Lollie Sails, MD;  Location: [facility] ENDOSCOPY;  Service: Endoscopy;  Laterality: N/A;    Family History  Problem Relation Age of Onset  . Hypertension Father   . Hypertension Mother     SOCIAL HX: Non-smoker.   Current Outpatient Medications:  .  amLODipine (NORVASC) 5 MG tablet, Take 1 tablet (5 mg total) by mouth daily., Disp: 90 tablet, Rfl: 3 .  escitalopram (LEXAPRO) 10 MG tablet, Take 1 tablet (10 mg total) by mouth daily., Disp: 90 tablet, Rfl: 1 .  gabapentin (NEURONTIN) 300 MG capsule, Take 300 mg by mouth 3 (three) times daily., Disp: , Rfl:  .  Melatonin 10 MG TABS, Take by mouth., Disp: , Rfl:  .  meloxicam (MOBIC) 15 MG tablet, Take 1 tablet (15 mg total) by mouth daily., Disp: 30 tablet, Rfl: 2 .  OMEPRAZOLE PO, Take by mouth., Disp: , Rfl:  .  oxyCODONE (OXY IR/ROXICODONE) 5 MG immediate release tablet, oxycodone 5 mg tablet  TAKE ONE TABLET BY MOUTH EVERY 4 6 HOURS AS NEEDED FOR PAIN, Disp: , Rfl:   EXAM: This is a telehealth telephone visit and thus no physical exam was completed.  ASSESSMENT AND PLAN:  Discussed the following assessment and plan:  Anxiety and depression Much improved.  She  will continue her current regimen.  Follow-up in 3 months.    I discussed the assessment and treatment plan with the patient. The patient was provided an opportunity to ask questions and all were answered. The patient agreed with the plan and demonstrated an understanding of the instructions.   The patient was advised to call back or seek an in-person evaluation if the symptoms worsen or if the condition fails to improve as anticipated.  I provided 8 minutes of non-face-to-face time during this encounter.   Tommi Rumps, MD

## 2019-02-05 DIAGNOSIS — Z01818 Encounter for other preprocedural examination: Secondary | ICD-10-CM | POA: Diagnosis not present

## 2019-02-07 DIAGNOSIS — E669 Obesity, unspecified: Secondary | ICD-10-CM | POA: Diagnosis not present

## 2019-02-07 DIAGNOSIS — G5601 Carpal tunnel syndrome, right upper limb: Secondary | ICD-10-CM | POA: Diagnosis not present

## 2019-02-07 DIAGNOSIS — I10 Essential (primary) hypertension: Secondary | ICD-10-CM | POA: Diagnosis not present

## 2019-02-07 DIAGNOSIS — M65332 Trigger finger, left middle finger: Secondary | ICD-10-CM | POA: Diagnosis not present

## 2019-02-07 DIAGNOSIS — Z981 Arthrodesis status: Secondary | ICD-10-CM | POA: Diagnosis not present

## 2019-02-07 DIAGNOSIS — K219 Gastro-esophageal reflux disease without esophagitis: Secondary | ICD-10-CM | POA: Diagnosis not present

## 2019-02-07 DIAGNOSIS — Z6834 Body mass index (BMI) 34.0-34.9, adult: Secondary | ICD-10-CM | POA: Diagnosis not present

## 2019-02-07 DIAGNOSIS — M65331 Trigger finger, right middle finger: Secondary | ICD-10-CM | POA: Diagnosis not present

## 2019-03-03 ENCOUNTER — Other Ambulatory Visit: Payer: Self-pay | Admitting: Family Medicine

## 2019-03-03 ENCOUNTER — Ambulatory Visit: Payer: Federal, State, Local not specified - PPO | Attending: Internal Medicine

## 2019-03-03 DIAGNOSIS — Z20828 Contact with and (suspected) exposure to other viral communicable diseases: Secondary | ICD-10-CM | POA: Diagnosis not present

## 2019-03-03 DIAGNOSIS — Z20822 Contact with and (suspected) exposure to covid-19: Secondary | ICD-10-CM

## 2019-03-03 MED ORDER — OMEPRAZOLE 10 MG PO CPDR: 10.0000 mg | DELAYED_RELEASE_CAPSULE | Freq: Every day | ORAL | 1 refills | Status: DC

## 2019-03-03 NOTE — Telephone Encounter (Signed)
Pt needs a refill on OMEPRAZOLE PO sent to [facility]

## 2019-03-05 LAB — NOVEL CORONAVIRUS, NAA: SARS-CoV-2, NAA: NOT DETECTED

## 2019-03-17 ENCOUNTER — Ambulatory Visit: Payer: Federal, State, Local not specified - PPO | Attending: Internal Medicine

## 2019-03-17 DIAGNOSIS — Z20822 Contact with and (suspected) exposure to covid-19: Secondary | ICD-10-CM | POA: Diagnosis not present

## 2019-03-18 LAB — NOVEL CORONAVIRUS, NAA: SARS-CoV-2, NAA: NOT DETECTED

## 2019-04-18 ENCOUNTER — Other Ambulatory Visit: Payer: Self-pay

## 2019-04-18 ENCOUNTER — Encounter: Payer: Self-pay | Admitting: Family Medicine

## 2019-04-18 ENCOUNTER — Ambulatory Visit (INDEPENDENT_AMBULATORY_CARE_PROVIDER_SITE_OTHER): Payer: Federal, State, Local not specified - PPO | Admitting: Family Medicine

## 2019-04-18 VITALS — Ht 62.0 in | Wt 190.0 lb

## 2019-04-18 DIAGNOSIS — E6609 Other obesity due to excess calories: Secondary | ICD-10-CM | POA: Diagnosis not present

## 2019-04-18 DIAGNOSIS — M25552 Pain in left hip: Secondary | ICD-10-CM | POA: Insufficient documentation

## 2019-04-18 DIAGNOSIS — F419 Anxiety disorder, unspecified: Secondary | ICD-10-CM | POA: Diagnosis not present

## 2019-04-18 DIAGNOSIS — F32A Depression, unspecified: Secondary | ICD-10-CM

## 2019-04-18 DIAGNOSIS — I10 Essential (primary) hypertension: Secondary | ICD-10-CM | POA: Diagnosis not present

## 2019-04-18 DIAGNOSIS — Z6834 Body mass index (BMI) 34.0-34.9, adult: Secondary | ICD-10-CM

## 2019-04-18 DIAGNOSIS — R252 Cramp and spasm: Secondary | ICD-10-CM | POA: Insufficient documentation

## 2019-04-18 DIAGNOSIS — F329 Major depressive disorder, single episode, unspecified: Secondary | ICD-10-CM

## 2019-04-18 NOTE — Assessment & Plan Note (Signed)
Potentially related to bursitis.  Discussed occasional use of ibuprofen.  We will send her exercises to complete.  If not improving over the next 3 to 4 weeks she will let us know and we can refer to orthopedics to consider injection.

## 2019-04-18 NOTE — Assessment & Plan Note (Signed)
Undetermined control.  To check over the weekend and send [place] her readings.  She will continue her current medication.  She will come in for lab work.

## 2019-04-18 NOTE — Progress Notes (Signed)
Virtual Visit via video Note  This visit type was conducted due to national recommendations for restrictions regarding the COVID-19 pandemic (e.g. social distancing).  This format is felt to be most appropriate for this patient at this time.  All issues noted in this document were discussed and addressed.  No physical exam was performed (except for noted visual exam findings with Video Visits).   I connected with Tammy Gregory today at 11:00 AM EST by a video enabled telemedicine application and verified that I am speaking with the correct person using two identifiers. Location patient: home Location provider: work Persons participating in the virtual visit: patient, provider  I discussed the limitations, risks, security and privacy concerns of performing an evaluation and management service by telephone and the availability of in person appointments. I also discussed with the patient that there may be a patient responsible charge related to this service. The patient expressed understanding and agreed to proceed.  Reason for visit: Follow-up  HPI: Anxiety/depression: Taking Nexium.  She notes no anxiety or depression symptoms.  She feels great with regards to this.  Left hip pain: Notes her left hip hurts laterally.  This has been going on for some time now.  Seems to be getting worse.  Hurts when she lays on it or when she is driving.  Does not hurt with movements.  She occasionally takes ibuprofen.  Hypertension: Not checking blood pressures.  Taking amlodipine.  No chest pain, shortness of breath, or edema.  Leg cramps: She had these several weeks ago.  She increased her water intake and they resolved.   ROS: See pertinent positives and negatives per HPI.  Past Medical History:  Diagnosis Date  . Chicken pox   . Headache   . Hypertension     Past Surgical History:  Procedure Laterality Date  . CERVICAL FUSION  12/03/2014  . CESAREAN SECTION     3  . COLONOSCOPY WITH PROPOFOL  N/A 04/02/2015   Procedure: COLONOSCOPY WITH PROPOFOL;  Surgeon: Lollie Sails, MD;  Location: [facility] ENDOSCOPY;  Service: Endoscopy;  Laterality: N/A;    Family History  Problem Relation Age of Onset  . Hypertension Father   . Hypertension Mother     SOCIAL HX: Non-smoker   Current Outpatient Medications:  .  amLODipine (NORVASC) 5 MG tablet, Take 1 tablet (5 mg total) by mouth daily., Disp: 90 tablet, Rfl: 3 .  escitalopram (LEXAPRO) 10 MG tablet, Take 1 tablet (10 mg total) by mouth daily., Disp: 90 tablet, Rfl: 1 .  gabapentin (NEURONTIN) 300 MG capsule, Take 300 mg by mouth 3 (three) times daily., Disp: , Rfl:  .  Melatonin 10 MG TABS, Take by mouth., Disp: , Rfl:  .  meloxicam (MOBIC) 15 MG tablet, Take 1 tablet (15 mg total) by mouth daily., Disp: 30 tablet, Rfl: 2 .  omeprazole (PRILOSEC) 10 MG capsule, Take 1 capsule (10 mg total) by mouth daily., Disp: 90 capsule, Rfl: 1 .  oxyCODONE (OXY IR/ROXICODONE) 5 MG immediate release tablet, oxycodone 5 mg tablet  TAKE ONE TABLET BY MOUTH EVERY 4 6 HOURS AS NEEDED FOR PAIN, Disp: , Rfl:   EXAM:  VITALS per patient if applicable:  GENERAL: alert, oriented, appears well and in no acute distress  HEENT: atraumatic, conjunttiva clear, no obvious abnormalities on inspection of external nose and ears  NECK: normal movements of the head and neck  LUNGS: on inspection no signs of respiratory distress, breathing rate appears normal, no obvious gross  SOB, gasping or wheezing  CV: no obvious cyanosis  MS: moves all visible extremities without noticeable abnormality  PSYCH/NEURO: pleasant and cooperative, no obvious depression or anxiety, speech and thought processing grossly intact  ASSESSMENT AND PLAN:  Discussed the following assessment and plan:  Hypertension Undetermined control.  To check over the weekend and send [place] her readings.  She will continue her current medication.  She will come in for lab work.  Left hip  pain Potentially related to bursitis.  Discussed occasional use of ibuprofen.  We will send her exercises to complete.  If not improving over the next 3 to 4 weeks she will let us know and we can refer to orthopedics to consider injection.  Anxiety and depression Asymptomatic.  Continue Lexapro for 9-12 more months and then consider tapering off.  Foot cramps Improved with increased water intake.  Discussed continuing adequate water intake.   Orders Placed This Encounter  Procedures  . Comp Met (CMET)    Standing Status:   Future    Standing Expiration Date:   04/17/2020  . Lipid panel    Standing Status:   Future    Standing Expiration Date:   04/17/2020  . HgB A1c    Standing Status:   Future    Standing Expiration Date:   04/17/2020    No orders of the defined types were placed in this encounter.    I discussed the assessment and treatment plan with the patient. The patient was provided an opportunity to ask questions and all were answered. The patient agreed with the plan and demonstrated an understanding of the instructions.   The patient was advised to call back or seek an in-person evaluation if the symptoms worsen or if the condition fails to improve as anticipated.    Tommi Rumps, MD

## 2019-04-18 NOTE — Patient Instructions (Signed)
Hip Bursitis Rehab Ask your health care provider which exercises are safe for you. Do exercises exactly as told by your health care provider and adjust them as directed. It is normal to feel mild stretching, pulling, tightness, or discomfort as you do these exercises. Stop right away if you feel sudden pain or your pain gets worse. Do not begin these exercises until told by your health care provider. Stretching exercise This exercise warms up your muscles and joints and improves the movement and flexibility of your hip. This exercise also helps to relieve pain and stiffness. Iliotibial band stretch An iliotibial band is a strong band of muscle tissue that runs from the outer side of your hip to the outer side of your thigh and knee. 1. Lie on your side with your left / right leg in the top position. 2. Bend your left / right knee and grab your ankle. Stretch out your bottom arm to help you balance. 3. Slowly bring your knee back so your thigh is behind your body. 4. Slowly lower your knee toward the floor until you feel a gentle stretch on the outside of your left / right thigh. If you do not feel a stretch and your knee will not fall farther, place the heel of your other foot on top of your knee and pull your knee down toward the floor with your foot. 5. Hold this position for __________ seconds. 6. Slowly return to the starting position. Repeat __________ times. Complete this exercise __________ times a day. Strengthening exercises These exercises build strength and endurance in your hip and pelvis. Endurance is the ability to use your muscles for a long time, even after they get tired. Bridge This exercise strengthens the muscles that move your thigh backward (hip extensors). 1. Lie on your back on a firm surface with your knees bent and your feet flat on the floor. 2. Tighten your buttocks muscles and lift your buttocks off the floor until your trunk is level with your thighs. ? Do not arch  your back. ? You should feel the muscles working in your buttocks and the back of your thighs. If you do not feel these muscles, slide your feet 1-2 inches (2.5-5 cm) farther away from your buttocks. ? If this exercise is too easy, try doing it with your arms crossed over your chest. 3. Hold this position for __________ seconds. 4. Slowly lower your hips to the starting position. 5. Let your muscles relax completely after each repetition. Repeat __________ times. Complete this exercise __________ times a day. Squats This exercise strengthens the muscles in front of your thigh and knee (quadriceps). 1. Stand in front of a table, with your feet and knees pointing straight ahead. You may rest your hands on the table for balance but not for support. 2. Slowly bend your knees and lower your hips like you are going to sit in a chair. ? Keep your weight over your heels, not over your toes. ? Keep your lower legs upright so they are parallel with the table legs. ? Do not let your hips go lower than your knees. ? Do not bend lower than told by your health care provider. ? If your hip pain increases, do not bend as low. 3. Hold the squat position for __________ seconds. 4. Slowly push with your legs to return to standing. Do not use your hands to pull yourself to standing. Repeat __________ times. Complete this exercise __________ times a day. Hip hike 1. Stand   sideways on a bottom step. Stand on your left / right leg with your other foot unsupported next to the step. You can hold on to the railing or wall for balance if needed. 2. Keep your knees straight and your torso square. Then lift your left / right hip up toward the ceiling. 3. Hold this position for __________ seconds. 4. Slowly let your left / right hip lower toward the floor, past the starting position. Your foot should get closer to the floor. Do not lean or bend your knees. Repeat __________ times. Complete this exercise __________ times a  day. Single leg stand 1. Without shoes, stand near a railing or in a doorway. You may hold on to the railing or door frame as needed for balance. 2. Squeeze your left / right buttock muscles, then lift up your other foot. ? Do not let your left / right hip push out to the side. ? It is helpful to stand in front of a mirror for this exercise so you can watch your hip. 3. Hold this position for __________ seconds. Repeat __________ times. Complete this exercise __________ times a day. This information is not intended to replace advice given to you by your health care provider. Make sure you discuss any questions you have with your health care provider. Document Revised: 06/17/2018 Document Reviewed: 06/17/2018 Elsevier Patient Education  2020 [employer]  

## 2019-04-18 NOTE — Assessment & Plan Note (Signed)
Improved with increased water intake.  Discussed continuing adequate water intake.

## 2019-04-18 NOTE — Assessment & Plan Note (Signed)
Asymptomatic.  Continue Lexapro for 9-12 more months and then consider tapering off.

## 2019-04-25 ENCOUNTER — Other Ambulatory Visit: Payer: Federal, State, Local not specified - PPO

## 2019-04-25 DIAGNOSIS — H02832 Dermatochalasis of right lower eyelid: Secondary | ICD-10-CM | POA: Diagnosis not present

## 2019-04-25 DIAGNOSIS — H02834 Dermatochalasis of left upper eyelid: Secondary | ICD-10-CM | POA: Diagnosis not present

## 2019-04-25 DIAGNOSIS — H02835 Dermatochalasis of left lower eyelid: Secondary | ICD-10-CM | POA: Diagnosis not present

## 2019-04-25 DIAGNOSIS — H02423 Myogenic ptosis of bilateral eyelids: Secondary | ICD-10-CM | POA: Diagnosis not present

## 2019-04-25 DIAGNOSIS — H02831 Dermatochalasis of right upper eyelid: Secondary | ICD-10-CM | POA: Diagnosis not present

## 2019-05-01 ENCOUNTER — Other Ambulatory Visit: Payer: Self-pay

## 2019-05-01 ENCOUNTER — Other Ambulatory Visit (INDEPENDENT_AMBULATORY_CARE_PROVIDER_SITE_OTHER): Payer: Federal, State, Local not specified - PPO

## 2019-05-01 ENCOUNTER — Telehealth: Payer: Self-pay | Admitting: Family Medicine

## 2019-05-01 DIAGNOSIS — E6609 Other obesity due to excess calories: Secondary | ICD-10-CM | POA: Diagnosis not present

## 2019-05-01 DIAGNOSIS — I10 Essential (primary) hypertension: Secondary | ICD-10-CM

## 2019-05-01 DIAGNOSIS — Z6834 Body mass index (BMI) 34.0-34.9, adult: Secondary | ICD-10-CM

## 2019-05-01 LAB — COMPREHENSIVE METABOLIC PANEL
ALT: 16 U/L (ref 0–35)
AST: 18 U/L (ref 0–37)
Albumin: 4.1 g/dL (ref 3.5–5.2)
Alkaline Phosphatase: 71 U/L (ref 39–117)
BUN: 19 mg/dL (ref 6–23)
CO2: 33 mEq/L — ABNORMAL HIGH (ref 19–32)
Calcium: 9.4 mg/dL (ref 8.4–10.5)
Chloride: 107 mEq/L (ref 96–112)
Creatinine, Ser: 0.86 mg/dL (ref 0.40–1.20)
GFR: 67.58 mL/min (ref >60.00)
Glucose, Bld: 87 mg/dL (ref 70–99)
Potassium: 4.1 mEq/L (ref 3.5–5.1)
Sodium: 144 mEq/L (ref 135–145)
Total Bilirubin: 0.5 mg/dL (ref 0.2–1.2)
Total Protein: 7 g/dL (ref 6.0–8.3)

## 2019-05-01 LAB — LIPID PANEL
Cholesterol: 172 mg/dL (ref 0–200)
HDL: 58.7 mg/dL (ref >39.00)
LDL Cholesterol: 94 mg/dL (ref 0–99)
NonHDL: 113.79
Total CHOL/HDL Ratio: 3
Triglycerides: 100 mg/dL (ref 0.0–149.0)
VLDL: 20 mg/dL (ref 0.0–40.0)

## 2019-05-01 NOTE — Telephone Encounter (Signed)
Pt dropped off copy of covid-19 vaccination record card. Placed in folder to be delivered to Dr. Caryl Bis inbox.

## 2019-05-01 NOTE — Telephone Encounter (Signed)
Covid vaccine entered in chart.  Kaheem Halleck,cma

## 2019-05-02 LAB — HEMOGLOBIN A1C: Hgb A1c MFr Bld: 5.1 % (ref 4.6–6.5)

## 2019-05-19 ENCOUNTER — Other Ambulatory Visit: Payer: Self-pay | Admitting: Family Medicine

## 2019-05-22 DIAGNOSIS — G5622 Lesion of ulnar nerve, left upper limb: Secondary | ICD-10-CM | POA: Diagnosis not present

## 2019-05-22 DIAGNOSIS — G5621 Lesion of ulnar nerve, right upper limb: Secondary | ICD-10-CM | POA: Diagnosis not present

## 2019-05-22 DIAGNOSIS — G5601 Carpal tunnel syndrome, right upper limb: Secondary | ICD-10-CM | POA: Diagnosis not present

## 2019-05-22 DIAGNOSIS — G5602 Carpal tunnel syndrome, left upper limb: Secondary | ICD-10-CM | POA: Diagnosis not present

## 2019-06-11 DIAGNOSIS — G5603 Carpal tunnel syndrome, bilateral upper limbs: Secondary | ICD-10-CM | POA: Diagnosis not present

## 2019-06-24 DIAGNOSIS — Z03818 Encounter for observation for suspected exposure to other biological agents ruled out: Secondary | ICD-10-CM | POA: Diagnosis not present

## 2019-06-24 DIAGNOSIS — Z20828 Contact with and (suspected) exposure to other viral communicable diseases: Secondary | ICD-10-CM | POA: Diagnosis not present

## 2019-06-25 ENCOUNTER — Other Ambulatory Visit: Payer: Self-pay

## 2019-06-25 ENCOUNTER — Encounter: Payer: Self-pay | Admitting: Family Medicine

## 2019-06-25 ENCOUNTER — Ambulatory Visit: Payer: Federal, State, Local not specified - PPO | Admitting: Family Medicine

## 2019-06-25 VITALS — BP 119/80 | HR 75 | Temp 96.8°F | Ht 62.0 in | Wt 196.0 lb

## 2019-06-25 DIAGNOSIS — F419 Anxiety disorder, unspecified: Secondary | ICD-10-CM

## 2019-06-25 DIAGNOSIS — R1011 Right upper quadrant pain: Secondary | ICD-10-CM | POA: Diagnosis not present

## 2019-06-25 DIAGNOSIS — K219 Gastro-esophageal reflux disease without esophagitis: Secondary | ICD-10-CM | POA: Diagnosis not present

## 2019-06-25 DIAGNOSIS — R413 Other amnesia: Secondary | ICD-10-CM | POA: Insufficient documentation

## 2019-06-25 DIAGNOSIS — F32A Depression, unspecified: Secondary | ICD-10-CM

## 2019-06-25 DIAGNOSIS — F329 Major depressive disorder, single episode, unspecified: Secondary | ICD-10-CM

## 2019-06-25 LAB — COMPREHENSIVE METABOLIC PANEL
ALT: 19 U/L (ref 0–35)
AST: 19 U/L (ref 0–37)
Albumin: 4.2 g/dL (ref 3.5–5.2)
Alkaline Phosphatase: 71 U/L (ref 39–117)
BUN: 18 mg/dL (ref 6–23)
CO2: 28 mEq/L (ref 19–32)
Calcium: 9.1 mg/dL (ref 8.4–10.5)
Chloride: 106 mEq/L (ref 96–112)
Creatinine, Ser: 0.87 mg/dL (ref 0.40–1.20)
GFR: 66.65 mL/min (ref >60.00)
Glucose, Bld: 106 mg/dL — ABNORMAL HIGH (ref 70–99)
Potassium: 3.9 mEq/L (ref 3.5–5.1)
Sodium: 141 mEq/L (ref 135–145)
Total Bilirubin: 0.7 mg/dL (ref 0.2–1.2)
Total Protein: 6.8 g/dL (ref 6.0–8.3)

## 2019-06-25 LAB — VITAMIN B12: Vitamin B-12: 1203 pg/mL — ABNORMAL HIGH (ref 211–911)

## 2019-06-25 LAB — TSH: TSH: 2.29 u[IU]/mL (ref 0.35–4.50)

## 2019-06-25 MED ORDER — OMEPRAZOLE 20 MG PO CPDR: 20.0000 mg | DELAYED_RELEASE_CAPSULE | Freq: Every day | ORAL | 3 refills | Status: DC

## 2019-06-25 NOTE — Patient Instructions (Signed)
Nice to see you.  We will get labs today and order an [place] for you.  If you develop constant abdominal pain or worsening symptoms please be evaluated again.

## 2019-06-25 NOTE — Assessment & Plan Note (Signed)
Has had some anxiety and stress though no depression.  We will leave her on her Lexapro for now that she is somewhat improved recently.

## 2019-06-25 NOTE — Assessment & Plan Note (Signed)
Chronic issue.  Potentially contributing to her bloating.  We will increase her omeprazole.  She will take this 30 minutes before breakfast.

## 2019-06-25 NOTE — Progress Notes (Signed)
Tommi Rumps, MD Phone: [phone]  KAMYIAH COLANTONIO [name] is a 59 y.o. female who presents today for f/u.  Anxiety: Patient notes she had increased stress.  She increased her Lexapro to 20 mg once daily for about a week though decreased it back to 10 mg.  She has been blinking a lot.  No depression.  Memory difficulty: Patient notes she has been forgetting names.  She has not gotten lost.  She thinks it may have been related to the increased stress that she was having.  She has been working on her doctorate degree.  Her father has been ill recently.  She notes that her memory difficulty has gotten better recently.  Burping/GERD/right upper quadrant pain: Patient notes he has been burping nonstop recently.  Vegetables typically cause this.  She has had right upper quadrant pain with eating fatty meals.  Does have some reflux.  She added Nexium to her omeprazole.  No blood in her stool.  No dysphagia.  Social History   Tobacco Use  Smoking Status Never Smoker  Smokeless Tobacco Never Used     ROS see history of present illness  Objective  Physical Exam Vitals:   06/25/19 0812  BP: 119/80  Pulse: 75  Temp: (!) 96.8 F (36 C)  SpO2: 97%    BP Readings from Last 3 Encounters:  06/25/19 119/80  02/22/18 110/76  11/30/17 106/80   Wt Readings from Last 3 Encounters:  06/25/19 196 lb (88.9 kg)  04/18/19 190 lb (86.2 kg)  01/10/19 188 lb (85.3 kg)    Physical Exam Constitutional:      General: She is not in acute distress.    Appearance: She is not diaphoretic.  Cardiovascular:     Rate and Rhythm: Normal rate and regular rhythm.     Heart sounds: Normal heart sounds.  Pulmonary:     Effort: Pulmonary effort is normal.     Breath sounds: Normal breath sounds.  Abdominal:     General: Bowel sounds are normal. There is no distension.     Palpations: Abdomen is soft.     Tenderness: There is abdominal tenderness (Reports slight sensitivity in the right upper  quadrant). There is no guarding or rebound.  Skin:    General: Skin is warm and dry.  Neurological:     Mental Status: She is alert.      Assessment/Plan: Please see individual problem list.  Anxiety and depression Has had some anxiety and stress though no depression.  We will leave her on her Lexapro for now that she is somewhat improved recently.  GERD (gastroesophageal reflux disease) Chronic issue.  Potentially contributing to her bloating.  We will increase her omeprazole.  She will take this 30 minutes before breakfast.  RUQ pain Possibly reflux related though could be gallbladder related.  We will check labs and order an ultrasound.  Treating reflux per above.  Memory difficulty Has been improving.  Possibly related to stress and anxiety.  She will monitor for now.  We will check a TSH and B12.   Orders Placed This Encounter  Procedures  . US Abdomen Limited RUQ    Standing Status:   Future    Standing Expiration Date:   08/24/2020    Order Specific Question:   Reason for Exam (SYMPTOM  OR DIAGNOSIS REQUIRED)    Answer:   RUQ pain with fatty foods, lots of burping    Order Specific Question:   Preferred imaging location?    Answer:  [facility]  . Comp Met (CMET)  . TSH  . B12    Meds ordered this encounter  Medications  . omeprazole (PRILOSEC) 20 MG capsule    Sig: Take 1 capsule (20 mg total) by mouth daily before breakfast.    Dispense:  30 capsule    Refill:  3    This visit occurred during the SARS-CoV-2 public health emergency.  Safety protocols were in place, including screening questions prior to the visit, additional usage of staff PPE, and extensive cleaning of exam room while observing appropriate contact time as indicated for disinfecting solutions.    Tommi Rumps, MD [place]

## 2019-06-25 NOTE — Assessment & Plan Note (Signed)
Possibly reflux related though could be gallbladder related.  We will check labs and order an ultrasound.  Treating reflux per above.

## 2019-06-25 NOTE — Assessment & Plan Note (Signed)
Has been improving.  Possibly related to stress and anxiety.  She will monitor for now.  We will check a TSH and B12.

## 2019-07-11 ENCOUNTER — Ambulatory Visit: Payer: Federal, State, Local not specified - PPO

## 2019-07-18 ENCOUNTER — Other Ambulatory Visit: Payer: Self-pay

## 2019-07-18 ENCOUNTER — Ambulatory Visit
Admission: RE | Admit: 2019-07-18 | Discharge: 2019-07-18 | Disposition: A | Discharge status: Home / Self Care | Payer: Federal, State, Local not specified - PPO | Source: Ambulatory Visit | Attending: Family Medicine | Admitting: Family Medicine

## 2019-07-18 DIAGNOSIS — R1011 Right upper quadrant pain: Secondary | ICD-10-CM

## 2019-07-18 DIAGNOSIS — K7689 Other specified diseases of liver: Secondary | ICD-10-CM | POA: Diagnosis not present

## 2019-07-25 ENCOUNTER — Other Ambulatory Visit: Payer: Self-pay | Admitting: Family Medicine

## 2019-07-25 DIAGNOSIS — K760 Fatty (change of) liver, not elsewhere classified: Secondary | ICD-10-CM

## 2019-08-05 ENCOUNTER — Other Ambulatory Visit: Payer: Self-pay

## 2019-08-06 ENCOUNTER — Ambulatory Visit: Payer: Federal, State, Local not specified - PPO | Admitting: Family Medicine

## 2019-08-06 ENCOUNTER — Encounter: Payer: Self-pay | Admitting: Family Medicine

## 2019-08-06 DIAGNOSIS — B029 Zoster without complications: Secondary | ICD-10-CM | POA: Diagnosis not present

## 2019-08-06 MED ORDER — TRAMADOL HCL 50 MG PO TABS: 50.0000 mg | ORAL_TABLET | Freq: Three times a day (TID) | ORAL | 0 refills | Status: DC | PRN

## 2019-08-06 MED ORDER — VALACYCLOVIR HCL 1 G PO TABS
1000.0000 mg | ORAL_TABLET | Freq: Three times a day (TID) | ORAL | 0 refills | Status: DC
Start: 2019-08-06 — End: 2021-06-09

## 2019-08-06 NOTE — Assessment & Plan Note (Addendum)
Patient's rash is consistent with shingles.  This is localized to the left C3 and C4 dermatomes.  We will start Valtrex.  Tramadol for pain.  No history of seizures.  Advised that she needs to stay away from her husband given his lack of chickenpox history.  Discussed the risk of chickenpox in an adult.  Advised to keep the area covered.  Discussed avoiding anyone who is pregnant or is immunocompromised.  She will contact us if she develops any worsening symptoms or she develops any symptoms on her face or in her high.  Advised to keep the area covered.

## 2019-08-06 NOTE — Progress Notes (Signed)
  Tommi Rumps, MD Phone: [phone]  Tammy Gregory Tammy Gregory is a 59 y.o. female who presents today for same day visit.   Shingles: Patient notes started his neck pain a week or so ago and then developed rash on her left upper chest.  She has subsequently developed erythematous papules over the left side of her neck as well.  Notes there is shooting pain related to this.  Also itching with the rash on her chest.  She notes it hurts behind her left ear and in her posterior neck.  She reports her husband has not had chickenpox in the past.  Social History   Tobacco Use  Smoking Status Never Smoker  Smokeless Tobacco Never Used     ROS see history of present illness  Objective  Physical Exam Vitals:   08/06/19 1517  BP: 140/90  Pulse: 69  Temp: (!) 97.3 F (36.3 C)  SpO2: 99%    BP Readings from Last 3 Encounters:  08/06/19 140/90  06/25/19 119/80  02/22/18 110/76   Wt Readings from Last 3 Encounters:  08/06/19 201 lb (91.2 kg)  06/25/19 196 lb (88.9 kg)  04/18/19 190 lb (86.2 kg)    Physical Exam HENT:     Left Ear: Tympanic membrane, ear canal and external ear normal.  Skin:          Assessment/Plan: Please see individual problem list.  Shingles Patient's rash is consistent with shingles.  This is localized to the left C3 and C4 dermatomes.  We will start Valtrex.  Tramadol for pain.  No history of seizures.  Advised that she needs to stay away from her husband given his lack of chickenpox history.  Discussed the risk of chickenpox in an adult.  Advised to keep the area covered.  Discussed avoiding anyone who is pregnant or is immunocompromised.  She will contact us if she develops any worsening symptoms or she develops any symptoms on her face or in her high.  Advised to keep the area covered.   No orders of the defined types were placed in this encounter.   Meds ordered this encounter  Medications  . valACYclovir (VALTREX) 1000 MG tablet    Sig:  Take 1 tablet (1,000 mg total) by mouth 3 (three) times daily.    Dispense:  21 tablet    Refill:  0  . traMADol (ULTRAM) 50 MG tablet    Sig: Take 1 tablet (50 mg total) by mouth every 8 (eight) hours as needed for up to 5 days.    Dispense:  15 tablet    Refill:  0    This visit occurred during the SARS-CoV-2 public health emergency.  Safety protocols were in place, including screening questions prior to the visit, additional usage of staff PPE, and extensive cleaning of exam room while observing appropriate contact time as indicated for disinfecting solutions.    Tommi Rumps, MD [place]

## 2019-08-06 NOTE — Patient Instructions (Signed)
Nice to see you. We will treat you with Valtrex.  You can take the tramadol for pain.  This may make you drowsy.  If it does please do not drive. If you develop fevers or significantly worsening pain please contact us immediately. Please avoid your husband. Please keep the area covered. Please avoid anyone who is pregnant or immunocompromise or has not had chickenpox.    Shingles  Shingles is an infection. It gives you a painful skin rash and blisters that have fluid in them. Shingles is caused by the same germ (virus) that causes chickenpox. Shingles only happens in people who:  Have had chickenpox.  Have been given a shot of medicine (vaccine) to protect against chickenpox. Shingles is rare in this group. The first symptoms of shingles may be itching, tingling, or pain in an area on your skin. A rash will show on your skin a few days or weeks later. The rash is likely to be on one side of your body. The rash usually has a shape like a belt or a band. Over time, the rash turns into fluid-filled blisters. The blisters will break open, change into scabs, and dry up. Medicines may:  Help with pain and itching.  Help you get better sooner.  Help to prevent long-term problems. Follow these instructions at home: Medicines  Take over-the-counter and prescription medicines only as told by your doctor.  Put on an anti-itch cream or numbing cream where you have a rash, blisters, or scabs. Do this as told by your doctor. Helping with itching and discomfort   Put cold, wet cloths (cold compresses) on the area of the rash or blisters as told by your doctor.  Cool baths can help you feel better. Try adding baking soda or dry oatmeal to the water to lessen itching. Do not bathe in hot water. Blister and rash care  Keep your rash covered with a loose bandage (dressing).  Wear loose clothing that does not rub on your rash.  Keep your rash and blisters clean. To do this, wash the area with  mild soap and cool water as told by your doctor.  Check your rash every day for signs of infection. Check for: ? More redness, swelling, or pain. ? Fluid or blood. ? Warmth. ? Pus or a bad smell.  Do not scratch your rash. Do not pick at your blisters. To help you to not scratch: ? Keep your fingernails clean and cut short. ? Wear gloves or mittens when you sleep, if scratching is a problem. General instructions  Rest as told by your doctor.  Keep all follow-up visits as told by your doctor. This is important.  Wash your hands often with soap and water. If soap and water are not available, use hand sanitizer. Doing this lowers your chance of getting a skin infection caused by germs (bacteria).  Your infection can cause chickenpox in people who have never had chickenpox or never got a shot of chickenpox vaccine. If you have blisters that did not change into scabs yet, try not to touch other people or be around other people, especially: ? Babies. ? Pregnant women. ? Children who have areas of red, itchy, or rough skin (eczema). ? Very old people who have transplants. ? People who have a long-term (chronic) sickness, like cancer or AIDS. Contact a doctor if:  Your pain does not get better with medicine.  Your pain does not get better after the rash heals.  You have any signs  of infection in the rash area. These signs include: ? More redness, swelling, or pain around the rash. ? Fluid or blood coming from the rash. ? The rash area feeling warm to the touch. ? Pus or a bad smell coming from the rash. Get help right away if:  The rash is on your face or nose.  You have pain in your face or pain by your eye.  You lose feeling on one side of your face.  You have trouble seeing.  You have ear pain, or you have ringing in your ear.  You have a loss of taste.  Your condition gets worse. Summary  Shingles gives you a painful skin rash and blisters that have fluid in  them.  Shingles is an infection. It is caused by the same germ (virus) that causes chickenpox.  Keep your rash covered with a loose bandage (dressing). Wear loose clothing that does not rub on your rash.  If you have blisters that did not change into scabs yet, try not to touch other people or be around people. This information is not intended to replace advice given to you by your health care provider. Make sure you discuss any questions you have with your health care provider. Document Revised: 06/14/2018 Document Reviewed: 10/25/2016 Elsevier Patient Education  2020 [employer].

## 2019-08-11 ENCOUNTER — Telehealth: Payer: Self-pay

## 2019-08-11 ENCOUNTER — Ambulatory Visit
Admission: EM | Admit: 2019-08-11 | Discharge: 2019-08-11 | Disposition: A | Discharge status: Home / Self Care | Payer: Federal, State, Local not specified - PPO | Attending: Emergency Medicine | Admitting: Emergency Medicine

## 2019-08-11 ENCOUNTER — Other Ambulatory Visit: Payer: Self-pay | Admitting: Family Medicine

## 2019-08-11 ENCOUNTER — Other Ambulatory Visit: Payer: Self-pay

## 2019-08-11 DIAGNOSIS — B029 Zoster without complications: Secondary | ICD-10-CM | POA: Diagnosis not present

## 2019-08-11 MED ORDER — TRAMADOL HCL 50 MG PO TABS: 50.0000 mg | ORAL_TABLET | Freq: Four times a day (QID) | ORAL | 0 refills | Status: DC | PRN

## 2019-08-11 NOTE — Telephone Encounter (Signed)
Patient called and was seen last week with shingles and  stated her ear was pounding and she is in severe pain, I informed the provider and because he had no openings he wanted her to see urgent care, I informed her and she agreed to go.  Tammy Gregory,cma

## 2019-08-11 NOTE — Telephone Encounter (Signed)
Unable to leave message phone lept ringing.  Caller states she was dx with shingles. She is in extreme pain. There is intense pain in ear and neck pain with stiffiness.. It is a shooting pain. She is finished with the pain medication. She has one more left and taking it as prescribed. It does not hold her. She is not able to function currenly due to the pain.   [place] RECORD AccessNurse Patient Name: Tammy Gregory [facility] Gender: Female DOB: Aug 02, 1960 Age: 59 Y 71 M 11 D Return Phone Number: [phone] (Primary) Address: City/State/Zip: [place] [address] Client [facility] Client Site [place] Physician Tommi Rumps - MD Contact Type Call Who Is Calling Patient / Member / Family / Caregiver Call Type Triage / Clinical Relationship To Patient Self Return Phone Number [phone] (Primary) Chief Complaint Neck Stiffness Reason for Call Symptomatic / Request for [place] states she was dx with shingles. She is in extreme pain. There is intense pain in ear and neck pain with stiffiness.. It is a shooting pain. She is finished with the pain medication. She has one more left and taking it as prescribed. It does not hold her. She is not able to function currenly due to the pain. Translation No Nurse Assessment Nurse: Martyn Ehrich, RN, [place] Date/Time (Eastern Time): 08/10/2019 5:46:24 PM Confirm and document reason for call. If symptomatic, describe symptoms. ---dx with shingles Weds. the 2nd . She had spots on her head noted Tues. After Tues she had rash on L neck and was dx with shingles and started her on antiviral and a pain medication tramadol 50 mg. She has one tramadol left. It doesnt help as much as she would like. The Ear is painful. Pain is 10 Has the patient had close contact with a person  known or suspected to have the novel coronavirus illness OR traveled / lives in area with major community spread (including international travel) in the last 14 days from the onset of symptoms? * If Asymptomatic, screen for exposure and travel within the last 14 days. ---No Does the patient have any new or worsening symptoms? ---Yes Will a triage be completed? ---Yes Related visit to physician within the last 2 weeks? ---Yes Does the PT have any chronic conditions? (i.e. diabetes, asthma, this includes High risk factors for pregnancy, etc.) ---No Is this a behavioral health or substance abuse call? ---NoPLEASE NOTE: All timestamps contained within this report are represented as [place] Standard Time. CONFIDENTIALTY NOTICE: This fax transmission is intended only for the addressee. It contains information that is legally privileged, confidential or otherwise protected from use or disclosure. If you are not the intended recipient, you are strictly prohibited from reviewing, disclosing, copying using or disseminating any of this information or taking any action in reliance on or regarding this information. If you have received this fax in error, please notify us immediately by telephone so that we can arrange for its return to [place]. Phone: [phone], Toll-Free: [phone], Fax: [phone] Page: 2 of 2 Call Id: [id number] Guidelines Guideline Title Affirmed Question Affirmed Notes Nurse Date/Time Eilene Ghazi Time) Shingles [1] Shingles rash of face or ear AND [2] earache or ringing in the ear Martyn Ehrich, RN, [facility] 10/07/4194 2:22:97 PM Disp. Time Eilene Ghazi Time) Disposition Final User 08/10/2019 5:53:17 PM Paged On Call back to [facility], RN, [facility] 11/11/9209 9:41:74 PM See HCP  within 4 Hours (or PCP triage) Yes Gaddy, RN, [place] Caller Disagree/Comply Disagree Caller Understands Yes PreDisposition InappropriateToAsk Care Advice Given Per Guideline SEE HCP WITHIN 4 HOURS (OR PCP  TRIAGE): * IF OFFICE WILL BE CLOSED AND NO PCP (PRIMARY CARE PROVIDER) SECOND-LEVEL TRIAGE: You need to be seen within the next 3 or 4 hours. A nearby Urgent Care Center [facility]) is often a good source of care. Another choice is to go to the ED. Go sooner if you become worse. * You become worse. Referrals GO TO FACILITY REFUSED Paging [facility] Phone DateTime Result/Outcome Message Type Notes Carol Ada [phone] 08/10/2019 5:53:17 PM Paged On Call Back to Call Center Doctor Paged please call [facility] RN [phone] @ [place], [place] 08/10/2019 5:59:39 PM Spoke with On Call - General Message Result she could have cellulitis on top of the shingles - go to UC now. called caller and gave her MD's response

## 2019-08-11 NOTE — ED Provider Notes (Signed)
Roderic Palau    CSN: [id number] Arrival date & time: 08/11/19  1715      History   Chief Complaint Chief Complaint  Patient presents with  . Herpes Zoster  . Otalgia    HPI Blake Goya is a 59 y.o. female.  Patient presents with painful rash on her right neck and upper chest.  She was seen by PCP on 08/06/2019; diagnosed with Shingles; treated with Valtrex and tramadol which she says has helped.  She is out of the tramadol and is having left ear pain; She contacted her PCP who advised her to be seen in person so that her ear could be evaluated.  No new lesions today; last lesion came up yesterday.  She denies fever or chills.  No lesions in her eyes, nose, or mouth.    The history is provided by the patient.    Past Medical History:  Diagnosis Date  . Chicken pox   . Headache   . Hypertension     Patient Active Problem List   Diagnosis Date Noted  . Shingles 08/06/2019  . Memory difficulty 06/25/2019  . RUQ pain 06/25/2019  . GERD (gastroesophageal reflux disease) 06/25/2019  . Left hip pain 04/18/2019  . Foot cramps 04/18/2019  . Left knee pain 07/19/2018  . Change in voice 11/30/2017  . DOE (dyspnea on exertion) 08/10/2017  . Fatty liver 05/10/2017  . Leg edema 05/10/2017  . Bilateral carpal tunnel syndrome 05/10/2017  . Acute bilateral low back pain with bilateral sciatica 10/10/2016  . Insomnia 02/28/2016  . Pure hypercholesterolemia 02/24/2016  . Anxiety and depression 07/23/2015  . Obesity 12/28/2014  . Hypertension 12/28/2014  . S/P cervical spinal fusion 12/28/2014  . Family history of breast cancer 10/11/2011    Past Surgical History:  Procedure Laterality Date  . CERVICAL FUSION  12/03/2014  . CESAREAN SECTION     3  . COLONOSCOPY WITH PROPOFOL N/A 04/02/2015   Procedure: COLONOSCOPY WITH PROPOFOL;  Surgeon: Lollie Sails, MD;  Location: [facility] ENDOSCOPY;  Service: Endoscopy;  Laterality: N/A;    OB History   No obstetric  history on file.      Home Medications    Prior to Admission medications   Medication Sig Start Date End Date Taking? Authorizing Provider  amLODipine (NORVASC) 5 MG tablet Take 1 tablet (5 mg total) by mouth daily. 10/09/18   Leone Haven, MD  escitalopram (LEXAPRO) 10 MG tablet TAKE 1 TABLET BY MOUTH EVERY DAY 05/19/19   Leone Haven, MD  gabapentin (NEURONTIN) 300 MG capsule Take 300 mg by mouth 3 (three) times daily. 07/04/18   [provider]  Melatonin 10 MG TABS Take by mouth.    [provider]  meloxicam (MOBIC) 15 MG tablet Take 1 tablet (15 mg total) by mouth daily. 10/10/18   Leone Haven, MD  omeprazole (PRILOSEC) 20 MG capsule Take 1 capsule (20 mg total) by mouth daily before breakfast. 06/25/19   Leone Haven, MD  oxyCODONE (OXY IR/ROXICODONE) 5 MG immediate release tablet oxycodone 5 mg tablet  TAKE ONE TABLET BY MOUTH EVERY 4 6 HOURS AS NEEDED FOR PAIN    [provider]  traMADol (ULTRAM) 50 MG tablet Take 1 tablet (50 mg total) by mouth every 6 (six) hours as needed. 08/11/19   Sharion Balloon, NP  valACYclovir (VALTREX) 1000 MG tablet Take 1 tablet (1,000 mg total) by mouth 3 (three) times daily. 08/06/19   Caryl Bis,  Angela Adam, MD    Family History Family History  Problem Relation Age of Onset  . Hypertension Father   . Hypertension Mother     Social History Social History   Tobacco Use  . Smoking status: Never Smoker  . Smokeless tobacco: Never Used  Substance Use Topics  . Alcohol use: Yes    Comment: occ  . Drug use: No     Allergies   Codeine, Amoxicillin, and Clindamycin   Review of Systems Review of Systems  Constitutional: Negative for chills and fever.  HENT: Positive for ear pain. Negative for sore throat.   Eyes: Negative for pain and visual disturbance.  Respiratory: Negative for cough and shortness of breath.   Cardiovascular: Negative for chest pain and palpitations.  Gastrointestinal: Negative  for abdominal pain and vomiting.  Genitourinary: Negative for dysuria and hematuria.  Musculoskeletal: Negative for arthralgias and back pain.  Skin: Positive for rash. Negative for color change.  Neurological: Negative for seizures and syncope.  All other systems reviewed and are negative.    Physical Exam Triage Vital Signs ED Triage Vitals [08/11/19 1716]  Enc Vitals Group     BP      Pulse      Resp      Temp      Temp src      SpO2      Weight      Height      Head Circumference      Peak Flow      Pain Score 6     Pain Loc      Pain Edu?      Excl. in [place]?    No data found.  Updated Vital Signs BP (!) 163/88 (BP Location: Left Arm)   Pulse 70   Temp 97.9 F (36.6 C) (Temporal)   Resp 14   LMP 04/02/2007 (Approximate)   SpO2 97%   Visual Acuity Right Eye Distance:   Left Eye Distance:   Bilateral Distance:    Right Eye Near:   Left Eye Near:    Bilateral Near:     Physical Exam Vitals and nursing note reviewed.  Constitutional:      General: She is not in acute distress.    Appearance: She is well-developed.  HENT:     Head: Normocephalic and atraumatic.     Right Ear: Tympanic membrane and ear canal normal.     Left Ear: Tympanic membrane and ear canal normal.     Nose: Nose normal.     Mouth/Throat:     Mouth: Mucous membranes are moist.     Pharynx: Oropharynx is clear.  Eyes:     Conjunctiva/sclera: Conjunctivae normal.  Cardiovascular:     Rate and Rhythm: Normal rate and regular rhythm.     Heart sounds: No murmur.  Pulmonary:     Effort: Pulmonary effort is normal. No respiratory distress.     Breath sounds: Normal breath sounds.  Abdominal:     Palpations: Abdomen is soft.     Tenderness: There is no abdominal tenderness. There is no guarding or rebound.  Musculoskeletal:     Cervical back: Neck supple.  Skin:    General: Skin is warm and dry.     Findings: Rash present.     Comments: Patches of erythematous papules on neck and  upper chest.  None on face.  See pictures for details.    Neurological:     General: No focal deficit  present.     Mental Status: She is alert and oriented to person, place, and time.     Gait: Gait normal.  Psychiatric:        Mood and Affect: Mood normal.        Behavior: Behavior normal.          UC Treatments / Results  Labs (all labs ordered are listed, but only abnormal results are displayed) Labs Reviewed - No data to display  EKG   Radiology No results found.  Procedures Procedures (including critical care time)  Medications Ordered in UC Medications - No data to display  Initial Impression / Assessment and Plan / UC Course  I have reviewed the triage vital signs and the nursing notes.  Pertinent labs & imaging results that were available during my care of the patient were reviewed by me and considered in my medical decision making (see chart for details).   Herpes zoster.  Continue Valtrex as ordered.  Treating pain with refill on tramadol.  Precautions for drowsiness with tramadol discussed.  Discussed wound care and signs of infection.  Instructed patient to follow up with her PCP if her symptoms are not improving.  She agrees to plan of care.      Final Clinical Impressions(s) / UC Diagnoses   Final diagnoses:  Herpes zoster without complication     Discharge Instructions     Take the tramadol as directed; Do not drive, operate machinery, or drink alcohol with this medication as it may cause drowsiness.   Continue taking the Valtrex as directed by your doctor.   Follow up with your primary care provider if your symptoms are not improving.       ED Prescriptions    Medication Sig Dispense Auth. Provider   traMADol (ULTRAM) 50 MG tablet Take 1 tablet (50 mg total) by mouth every 6 (six) hours as needed. 15 tablet Sharion Balloon, NP     I have reviewed the PDMP during this encounter.   Sharion Balloon, NP 08/11/19 1749

## 2019-08-11 NOTE — Discharge Instructions (Signed)
Take the tramadol as directed; Do not drive, operate machinery, or drink alcohol with this medication as it may cause drowsiness.   Continue taking the Valtrex as directed by your doctor.   Follow up with your primary care provider if your symptoms are not improving.

## 2019-08-11 NOTE — Telephone Encounter (Signed)
Refill request for tramadol, last seen 08-06-19, last filled 08-06-19.  Please advise.

## 2019-08-11 NOTE — Telephone Encounter (Signed)
Please follow-up with the patient. Please see if she went to urgent care as advised by the on-call provider. Please see how she is doing today. Thanks.

## 2019-08-11 NOTE — ED Triage Notes (Signed)
C/o shingles on chest and neck. Saw PCP for issue last week, but patient now experiencing sharp shooting ear pain. States she is at 6/10 constantly and 10/10 when ear pain happens. She has been taking valacyclovir and tramadol. Reports the rash has gone down with the valacyclovir. The tramadol helped take the edge off, but repots she is out.

## 2019-08-11 NOTE — Telephone Encounter (Signed)
Unable to leave message for patient to return call back.  

## 2019-08-11 NOTE — Telephone Encounter (Signed)
Pt called in need refill on traMADol (ULTRAM) 50 MG tablet

## 2019-08-11 NOTE — Telephone Encounter (Signed)
Patient did not go to UC. Patient is still having the same sx as before. She doesn't want to go to UC. The tramadol takes the edge off. But it doesn't fully help.

## 2019-08-11 NOTE — Telephone Encounter (Signed)
See other phone note

## 2019-08-23 ENCOUNTER — Other Ambulatory Visit: Payer: Self-pay | Admitting: Family Medicine

## 2019-09-03 ENCOUNTER — Ambulatory Visit: Payer: Federal, State, Local not specified - PPO | Admitting: Family Medicine

## 2019-09-03 DIAGNOSIS — Z0289 Encounter for other administrative examinations: Secondary | ICD-10-CM

## 2019-09-17 DIAGNOSIS — H02834 Dermatochalasis of left upper eyelid: Secondary | ICD-10-CM | POA: Diagnosis not present

## 2019-09-17 DIAGNOSIS — H5203 Hypermetropia, bilateral: Secondary | ICD-10-CM | POA: Diagnosis not present

## 2019-09-17 DIAGNOSIS — H02831 Dermatochalasis of right upper eyelid: Secondary | ICD-10-CM | POA: Diagnosis not present

## 2019-09-17 DIAGNOSIS — H2513 Age-related nuclear cataract, bilateral: Secondary | ICD-10-CM | POA: Diagnosis not present

## 2019-09-21 ENCOUNTER — Other Ambulatory Visit: Payer: Self-pay | Admitting: Family Medicine

## 2019-09-30 ENCOUNTER — Telehealth: Payer: Self-pay | Admitting: Family Medicine

## 2019-09-30 NOTE — Telephone Encounter (Signed)
Rejection Reason - Other - Pt cancelled appt: "cx per pt call; pt wcbtrs when she knows her schedule"" [facility] said about 23 hours ago

## 2019-10-03 DIAGNOSIS — Z03818 Encounter for observation for suspected exposure to other biological agents ruled out: Secondary | ICD-10-CM | POA: Diagnosis not present

## 2019-10-03 DIAGNOSIS — Z20822 Contact with and (suspected) exposure to covid-19: Secondary | ICD-10-CM | POA: Diagnosis not present

## 2019-10-08 ENCOUNTER — Other Ambulatory Visit: Payer: Self-pay | Admitting: Family Medicine

## 2019-10-22 ENCOUNTER — Telehealth: Payer: Federal, State, Local not specified - PPO | Admitting: Family Medicine

## 2019-11-12 ENCOUNTER — Telehealth: Payer: Federal, State, Local not specified - PPO | Admitting: Family Medicine

## 2019-11-27 ENCOUNTER — Other Ambulatory Visit: Payer: Self-pay | Admitting: Family Medicine

## 2019-12-12 ENCOUNTER — Encounter: Payer: Self-pay | Admitting: Family Medicine

## 2019-12-12 ENCOUNTER — Other Ambulatory Visit: Payer: Self-pay

## 2019-12-12 ENCOUNTER — Telehealth (INDEPENDENT_AMBULATORY_CARE_PROVIDER_SITE_OTHER): Payer: Federal, State, Local not specified - PPO | Admitting: Family Medicine

## 2019-12-12 VITALS — BP 130/78 | Ht 62.0 in | Wt 201.0 lb

## 2019-12-12 DIAGNOSIS — Z1231 Encounter for screening mammogram for malignant neoplasm of breast: Secondary | ICD-10-CM

## 2019-12-12 DIAGNOSIS — G8929 Other chronic pain: Secondary | ICD-10-CM

## 2019-12-12 DIAGNOSIS — E66812 Obesity, class 2: Secondary | ICD-10-CM

## 2019-12-12 DIAGNOSIS — K219 Gastro-esophageal reflux disease without esophagitis: Secondary | ICD-10-CM

## 2019-12-12 DIAGNOSIS — F419 Anxiety disorder, unspecified: Secondary | ICD-10-CM | POA: Diagnosis not present

## 2019-12-12 DIAGNOSIS — Z6836 Body mass index (BMI) 36.0-36.9, adult: Secondary | ICD-10-CM

## 2019-12-12 DIAGNOSIS — I10 Essential (primary) hypertension: Secondary | ICD-10-CM

## 2019-12-12 DIAGNOSIS — Z1239 Encounter for other screening for malignant neoplasm of breast: Secondary | ICD-10-CM | POA: Insufficient documentation

## 2019-12-12 DIAGNOSIS — M25562 Pain in left knee: Secondary | ICD-10-CM | POA: Diagnosis not present

## 2019-12-12 DIAGNOSIS — F32A Depression, unspecified: Secondary | ICD-10-CM

## 2019-12-12 MED ORDER — MELOXICAM 15 MG PO TABS: 15.0000 mg | ORAL_TABLET | Freq: Every day | ORAL | 2 refills | Status: DC | PRN

## 2019-12-12 NOTE — Progress Notes (Signed)
Virtual Visit via video Note  This visit type was conducted due to national recommendations for restrictions regarding the COVID-19 pandemic (e.g. social distancing).  This format is felt to be most appropriate for this patient at this time.  All issues noted in this document were discussed and addressed.  No physical exam was performed (except for noted visual exam findings with Video Visits).   I connected with Tammy Gregory today at  4:00 PM EDT by a video enabled telemedicine application or telephone and verified that I am speaking with the correct person using two identifiers. Location patient: home Location provider: work Persons participating in the virtual visit: patient, provider  I discussed the limitations, risks, security and privacy concerns of performing an evaluation and management service by telephone and the availability of in person appointments. I also discussed with the patient that there may be a patient responsible charge related to this service. The patient expressed understanding and agreed to proceed.  Reason for visit: f/u.  HPI: Hypertension: The highest it has been is 130/78.  Taking amlodipine 5 mg once daily.  No chest pain, shortness of breath, or edema.  Anxiety/depression: Patient notes she has been doing okay though has quite a bit more stress recently.  Her parents moved to [place] from [place] and she is now a primary caregiver for them.  She has caught her self snacking more.  She tried to taper down on the Lexapro though notes that did not really work all that well and she started back on 10 mg recently.  No SI.  Arthritic pain: Patient notes knee and hip pain occasionally particularly when she sits around a lot.  She started back on gabapentin to see if that would help with this.  She has not been taking meloxicam.  GERD: Patient notes she stopped the omeprazole as she stopped having burping.  No current reflux symptoms.  Obesity:  Patient notes some recent weight gain due to stress eating.  She notes she has a gym at home and has no excuses for not using it though has not been using it given the stress that she is under.  She is going to start on a diet with her husband which has been arranged through his work.  ROS: See pertinent positives and negatives per HPI.  Past Medical History:  Diagnosis Date  . Chicken pox   . Headache   . Hypertension     Past Surgical History:  Procedure Laterality Date  . CERVICAL FUSION  12/03/2014  . CESAREAN SECTION     3  . COLONOSCOPY WITH PROPOFOL N/A 04/02/2015   Procedure: COLONOSCOPY WITH PROPOFOL;  Surgeon: Lollie Sails, MD;  Location: [facility] ENDOSCOPY;  Service: Endoscopy;  Laterality: N/A;    Family History  Problem Relation Age of Onset  . Hypertension Father   . Hypertension Mother     SOCIAL HX: Non-smoker   Current Outpatient Medications:  .  amLODipine (NORVASC) 5 MG tablet, TAKE 1 TABLET BY MOUTH EVERY DAY, Disp: 90 tablet, Rfl: 3 .  escitalopram (LEXAPRO) 10 MG tablet, TAKE 1 TABLET BY MOUTH EVERY DAY, Disp: 90 tablet, Rfl: 1 .  ID NOW COVID-19 KIT, See admin instructions., Disp: , Rfl:  .  Melatonin 10 MG TABS, Take by mouth., Disp: , Rfl:  .  meloxicam (MOBIC) 15 MG tablet, Take 1 tablet (15 mg total) by mouth daily as needed for pain., Disp: 30 tablet, Rfl: 2 .  valACYclovir (VALTREX) 1000  MG tablet, Take 1 tablet (1,000 mg total) by mouth 3 (three) times daily., Disp: 21 tablet, Rfl: 0  EXAM:  VITALS per patient if applicable:  GENERAL: alert, oriented, appears well and in no acute distress  HEENT: atraumatic, conjunttiva clear, no obvious abnormalities on inspection of external nose and ears  NECK: normal movements of the head and neck  LUNGS: on inspection no signs of respiratory distress, breathing rate appears normal, no obvious gross SOB, gasping or wheezing  CV: no obvious cyanosis  MS: moves all visible extremities without  noticeable abnormality  PSYCH/NEURO: pleasant and cooperative, no obvious depression or anxiety, speech and thought processing grossly intact  ASSESSMENT AND PLAN:  Discussed the following assessment and plan:  Problem List Items Addressed This Visit    Anxiety and depression    She will continue Lexapro 10 mg once daily.  She will monitor her symptoms and she just restarted this.      Breast cancer screening    Patient is due for mammogram.  She was given the phone number to call to schedule this.      Relevant Orders   MM 3D SCREEN BREAST BILATERAL   GERD (gastroesophageal reflux disease)    Asymptomatic.  She will remain off of medication for now.      Hypertension - Primary    Adequately controlled.  Continue amlodipine 5 mg once daily.      Left knee pain    Patient with chronic arthritic issues in her knees and hips.  Discussed trial of meloxicam.  Advised to take with food.  Discussed if she has upset stomach with this she needs to let us know.      Obesity    Encouraged getting in some activity and healthy diet.          I discussed the assessment and treatment plan with the patient. The patient was provided an opportunity to ask questions and all were answered. The patient agreed with the plan and demonstrated an understanding of the instructions.   The patient was advised to call back or seek an in-person evaluation if the symptoms worsen or if the condition fails to improve as anticipated.   Tommi Rumps, MD

## 2019-12-12 NOTE — Assessment & Plan Note (Signed)
Encouraged getting in some activity and healthy diet.

## 2019-12-12 NOTE — Assessment & Plan Note (Signed)
Patient is due for mammogram.  She was given the phone number to call to schedule this.

## 2019-12-12 NOTE — Assessment & Plan Note (Signed)
She will continue Lexapro 10 mg once daily.  She will monitor her symptoms and she just restarted this.

## 2019-12-12 NOTE — Assessment & Plan Note (Signed)
Asymptomatic.  She will remain off of medication for now.

## 2019-12-12 NOTE — Assessment & Plan Note (Signed)
Patient with chronic arthritic issues in her knees and hips.  Discussed trial of meloxicam.  Advised to take with food.  Discussed if she has upset stomach with this she needs to let us know.

## 2019-12-12 NOTE — Assessment & Plan Note (Signed)
Adequately controlled.  Continue amlodipine 5 mg once daily.

## 2020-02-04 ENCOUNTER — Ambulatory Visit
Admission: RE | Admit: 2020-02-04 | Discharge: 2020-02-04 | Disposition: A | Discharge status: Home / Self Care | Payer: Federal, State, Local not specified - PPO | Source: Ambulatory Visit | Attending: Family Medicine | Admitting: Family Medicine

## 2020-02-04 ENCOUNTER — Other Ambulatory Visit: Payer: Self-pay

## 2020-02-04 DIAGNOSIS — Z1231 Encounter for screening mammogram for malignant neoplasm of breast: Secondary | ICD-10-CM

## 2020-02-06 ENCOUNTER — Inpatient Hospital Stay
Admission: RE | Admit: 2020-02-06 | Discharge: 2020-02-06 | Disposition: A | Discharge status: Home / Self Care | Payer: Self-pay | Source: Ambulatory Visit | Attending: *Deleted | Admitting: *Deleted

## 2020-02-06 ENCOUNTER — Other Ambulatory Visit: Payer: Self-pay | Admitting: *Deleted

## 2020-02-06 DIAGNOSIS — Z1231 Encounter for screening mammogram for malignant neoplasm of breast: Secondary | ICD-10-CM

## 2020-02-12 DIAGNOSIS — M170 Bilateral primary osteoarthritis of knee: Secondary | ICD-10-CM | POA: Diagnosis not present

## 2020-02-12 DIAGNOSIS — M2341 Loose body in knee, right knee: Secondary | ICD-10-CM | POA: Diagnosis not present

## 2020-02-20 DIAGNOSIS — M25552 Pain in left hip: Secondary | ICD-10-CM | POA: Diagnosis not present

## 2020-02-20 DIAGNOSIS — M7062 Trochanteric bursitis, left hip: Secondary | ICD-10-CM | POA: Diagnosis not present

## 2020-03-02 DIAGNOSIS — M2391 Unspecified internal derangement of right knee: Secondary | ICD-10-CM | POA: Diagnosis not present

## 2020-03-02 DIAGNOSIS — M1711 Unilateral primary osteoarthritis, right knee: Secondary | ICD-10-CM | POA: Diagnosis not present

## 2020-03-02 DIAGNOSIS — M2341 Loose body in knee, right knee: Secondary | ICD-10-CM | POA: Diagnosis not present

## 2020-03-17 DIAGNOSIS — M94261 Chondromalacia, right knee: Secondary | ICD-10-CM | POA: Diagnosis not present

## 2020-03-17 DIAGNOSIS — M1711 Unilateral primary osteoarthritis, right knee: Secondary | ICD-10-CM | POA: Diagnosis not present

## 2020-03-17 DIAGNOSIS — G8918 Other acute postprocedural pain: Secondary | ICD-10-CM | POA: Diagnosis not present

## 2020-03-17 DIAGNOSIS — M659 Synovitis and tenosynovitis, unspecified: Secondary | ICD-10-CM | POA: Diagnosis not present

## 2020-03-17 DIAGNOSIS — M65861 Other synovitis and tenosynovitis, right lower leg: Secondary | ICD-10-CM | POA: Diagnosis not present

## 2020-03-17 DIAGNOSIS — M2341 Loose body in knee, right knee: Secondary | ICD-10-CM | POA: Diagnosis not present

## 2020-03-17 DIAGNOSIS — M6751 Plica syndrome, right knee: Secondary | ICD-10-CM | POA: Diagnosis not present

## 2020-03-17 DIAGNOSIS — M25561 Pain in right knee: Secondary | ICD-10-CM | POA: Diagnosis not present

## 2020-03-21 ENCOUNTER — Other Ambulatory Visit: Payer: Self-pay | Admitting: Family Medicine

## 2020-03-23 ENCOUNTER — Other Ambulatory Visit: Payer: Self-pay | Admitting: Orthopedic Surgery

## 2020-03-23 ENCOUNTER — Other Ambulatory Visit (HOSPITAL_COMMUNITY): Payer: Self-pay | Admitting: Orthopedic Surgery

## 2020-03-23 DIAGNOSIS — Z9889 Other specified postprocedural states: Secondary | ICD-10-CM

## 2020-03-24 ENCOUNTER — Other Ambulatory Visit: Payer: Self-pay

## 2020-03-24 ENCOUNTER — Ambulatory Visit
Admission: RE | Admit: 2020-03-24 | Discharge: 2020-03-24 | Disposition: A | Discharge status: Home / Self Care | Payer: Federal, State, Local not specified - PPO | Source: Ambulatory Visit | Attending: Orthopedic Surgery | Admitting: Orthopedic Surgery

## 2020-03-24 DIAGNOSIS — M79661 Pain in right lower leg: Secondary | ICD-10-CM | POA: Diagnosis not present

## 2020-03-24 DIAGNOSIS — M79604 Pain in right leg: Secondary | ICD-10-CM | POA: Diagnosis not present

## 2020-03-24 DIAGNOSIS — Z9889 Other specified postprocedural states: Secondary | ICD-10-CM | POA: Diagnosis not present

## 2020-03-24 DIAGNOSIS — R600 Localized edema: Secondary | ICD-10-CM | POA: Diagnosis not present

## 2020-03-26 DIAGNOSIS — M25661 Stiffness of right knee, not elsewhere classified: Secondary | ICD-10-CM | POA: Diagnosis not present

## 2020-03-26 DIAGNOSIS — M25561 Pain in right knee: Secondary | ICD-10-CM | POA: Diagnosis not present

## 2020-03-26 DIAGNOSIS — M6281 Muscle weakness (generalized): Secondary | ICD-10-CM | POA: Diagnosis not present

## 2020-03-30 DIAGNOSIS — M25661 Stiffness of right knee, not elsewhere classified: Secondary | ICD-10-CM | POA: Diagnosis not present

## 2020-03-30 DIAGNOSIS — M6281 Muscle weakness (generalized): Secondary | ICD-10-CM | POA: Diagnosis not present

## 2020-03-30 DIAGNOSIS — M25561 Pain in right knee: Secondary | ICD-10-CM | POA: Diagnosis not present

## 2020-04-02 DIAGNOSIS — Z20822 Contact with and (suspected) exposure to covid-19: Secondary | ICD-10-CM | POA: Diagnosis not present

## 2020-04-02 DIAGNOSIS — U071 COVID-19: Secondary | ICD-10-CM | POA: Diagnosis not present

## 2020-04-14 DIAGNOSIS — M48062 Spinal stenosis, lumbar region with neurogenic claudication: Secondary | ICD-10-CM | POA: Diagnosis not present

## 2020-04-14 DIAGNOSIS — M25561 Pain in right knee: Secondary | ICD-10-CM | POA: Diagnosis not present

## 2020-04-14 DIAGNOSIS — M25661 Stiffness of right knee, not elsewhere classified: Secondary | ICD-10-CM | POA: Diagnosis not present

## 2020-04-14 DIAGNOSIS — M6281 Muscle weakness (generalized): Secondary | ICD-10-CM | POA: Diagnosis not present

## 2020-04-15 DIAGNOSIS — M25561 Pain in right knee: Secondary | ICD-10-CM | POA: Diagnosis not present

## 2020-04-15 DIAGNOSIS — M6281 Muscle weakness (generalized): Secondary | ICD-10-CM | POA: Diagnosis not present

## 2020-04-15 DIAGNOSIS — M25661 Stiffness of right knee, not elsewhere classified: Secondary | ICD-10-CM | POA: Diagnosis not present

## 2020-04-23 DIAGNOSIS — M6281 Muscle weakness (generalized): Secondary | ICD-10-CM | POA: Diagnosis not present

## 2020-04-23 DIAGNOSIS — M25661 Stiffness of right knee, not elsewhere classified: Secondary | ICD-10-CM | POA: Diagnosis not present

## 2020-04-23 DIAGNOSIS — M25561 Pain in right knee: Secondary | ICD-10-CM | POA: Diagnosis not present

## 2020-04-28 DIAGNOSIS — M25561 Pain in right knee: Secondary | ICD-10-CM | POA: Diagnosis not present

## 2020-04-28 DIAGNOSIS — M48062 Spinal stenosis, lumbar region with neurogenic claudication: Secondary | ICD-10-CM | POA: Diagnosis not present

## 2020-04-28 DIAGNOSIS — M6281 Muscle weakness (generalized): Secondary | ICD-10-CM | POA: Diagnosis not present

## 2020-04-28 DIAGNOSIS — M25661 Stiffness of right knee, not elsewhere classified: Secondary | ICD-10-CM | POA: Diagnosis not present

## 2020-04-28 DIAGNOSIS — M545 Low back pain, unspecified: Secondary | ICD-10-CM | POA: Diagnosis not present

## 2020-05-12 DIAGNOSIS — M25661 Stiffness of right knee, not elsewhere classified: Secondary | ICD-10-CM | POA: Diagnosis not present

## 2020-05-12 DIAGNOSIS — M6281 Muscle weakness (generalized): Secondary | ICD-10-CM | POA: Diagnosis not present

## 2020-05-12 DIAGNOSIS — M25561 Pain in right knee: Secondary | ICD-10-CM | POA: Diagnosis not present

## 2020-05-19 DIAGNOSIS — M5136 Other intervertebral disc degeneration, lumbar region: Secondary | ICD-10-CM | POA: Diagnosis not present

## 2020-05-19 DIAGNOSIS — M5126 Other intervertebral disc displacement, lumbar region: Secondary | ICD-10-CM | POA: Diagnosis not present

## 2020-05-20 DIAGNOSIS — Z96651 Presence of right artificial knee joint: Secondary | ICD-10-CM | POA: Diagnosis not present

## 2020-05-20 DIAGNOSIS — M6281 Muscle weakness (generalized): Secondary | ICD-10-CM | POA: Diagnosis not present

## 2020-05-20 DIAGNOSIS — M25661 Stiffness of right knee, not elsewhere classified: Secondary | ICD-10-CM | POA: Diagnosis not present

## 2020-05-20 DIAGNOSIS — M25561 Pain in right knee: Secondary | ICD-10-CM | POA: Diagnosis not present

## 2020-05-27 DIAGNOSIS — M6281 Muscle weakness (generalized): Secondary | ICD-10-CM | POA: Diagnosis not present

## 2020-05-27 DIAGNOSIS — M25661 Stiffness of right knee, not elsewhere classified: Secondary | ICD-10-CM | POA: Diagnosis not present

## 2020-05-27 DIAGNOSIS — M25561 Pain in right knee: Secondary | ICD-10-CM | POA: Diagnosis not present

## 2020-06-01 DIAGNOSIS — H02835 Dermatochalasis of left lower eyelid: Secondary | ICD-10-CM | POA: Diagnosis not present

## 2020-06-01 DIAGNOSIS — H02832 Dermatochalasis of right lower eyelid: Secondary | ICD-10-CM | POA: Diagnosis not present

## 2020-06-01 DIAGNOSIS — L988 Other specified disorders of the skin and subcutaneous tissue: Secondary | ICD-10-CM | POA: Diagnosis not present

## 2020-06-01 DIAGNOSIS — H02831 Dermatochalasis of right upper eyelid: Secondary | ICD-10-CM | POA: Diagnosis not present

## 2020-06-01 DIAGNOSIS — H02834 Dermatochalasis of left upper eyelid: Secondary | ICD-10-CM | POA: Diagnosis not present

## 2020-06-01 DIAGNOSIS — Z6837 Body mass index (BMI) 37.0-37.9, adult: Secondary | ICD-10-CM | POA: Diagnosis not present

## 2020-06-01 DIAGNOSIS — Z880 Allergy status to penicillin: Secondary | ICD-10-CM | POA: Diagnosis not present

## 2020-06-01 DIAGNOSIS — Z885 Allergy status to narcotic agent status: Secondary | ICD-10-CM | POA: Diagnosis not present

## 2020-06-01 DIAGNOSIS — E668 Other obesity: Secondary | ICD-10-CM | POA: Diagnosis not present

## 2020-06-01 DIAGNOSIS — Z881 Allergy status to other antibiotic agents status: Secondary | ICD-10-CM | POA: Diagnosis not present

## 2020-06-05 ENCOUNTER — Other Ambulatory Visit: Payer: Self-pay | Admitting: Family Medicine

## 2020-06-07 DIAGNOSIS — M6281 Muscle weakness (generalized): Secondary | ICD-10-CM | POA: Diagnosis not present

## 2020-06-07 DIAGNOSIS — M25661 Stiffness of right knee, not elsewhere classified: Secondary | ICD-10-CM | POA: Diagnosis not present

## 2020-06-07 DIAGNOSIS — M25561 Pain in right knee: Secondary | ICD-10-CM | POA: Diagnosis not present

## 2020-06-10 DIAGNOSIS — M25561 Pain in right knee: Secondary | ICD-10-CM | POA: Diagnosis not present

## 2020-06-10 DIAGNOSIS — M6281 Muscle weakness (generalized): Secondary | ICD-10-CM | POA: Diagnosis not present

## 2020-06-10 DIAGNOSIS — M48062 Spinal stenosis, lumbar region with neurogenic claudication: Secondary | ICD-10-CM | POA: Diagnosis not present

## 2020-06-10 DIAGNOSIS — M5126 Other intervertebral disc displacement, lumbar region: Secondary | ICD-10-CM | POA: Diagnosis not present

## 2020-06-10 DIAGNOSIS — M25661 Stiffness of right knee, not elsewhere classified: Secondary | ICD-10-CM | POA: Diagnosis not present

## 2020-06-10 DIAGNOSIS — Z6838 Body mass index (BMI) 38.0-38.9, adult: Secondary | ICD-10-CM | POA: Diagnosis not present

## 2020-06-12 ENCOUNTER — Other Ambulatory Visit: Payer: Self-pay | Admitting: Family Medicine

## 2020-06-17 DIAGNOSIS — M6281 Muscle weakness (generalized): Secondary | ICD-10-CM | POA: Diagnosis not present

## 2020-06-17 DIAGNOSIS — M25661 Stiffness of right knee, not elsewhere classified: Secondary | ICD-10-CM | POA: Diagnosis not present

## 2020-06-17 DIAGNOSIS — M25561 Pain in right knee: Secondary | ICD-10-CM | POA: Diagnosis not present

## 2020-07-05 DIAGNOSIS — Z9889 Other specified postprocedural states: Secondary | ICD-10-CM | POA: Diagnosis not present

## 2020-07-05 DIAGNOSIS — M1711 Unilateral primary osteoarthritis, right knee: Secondary | ICD-10-CM | POA: Diagnosis not present

## 2020-07-06 ENCOUNTER — Other Ambulatory Visit: Payer: Federal, State, Local not specified - PPO

## 2020-07-06 ENCOUNTER — Other Ambulatory Visit: Payer: Self-pay

## 2020-07-06 ENCOUNTER — Encounter: Payer: Self-pay | Admitting: Family Medicine

## 2020-07-06 ENCOUNTER — Telehealth (INDEPENDENT_AMBULATORY_CARE_PROVIDER_SITE_OTHER): Payer: Federal, State, Local not specified - PPO | Admitting: Family Medicine

## 2020-07-06 DIAGNOSIS — R059 Cough, unspecified: Secondary | ICD-10-CM

## 2020-07-06 MED ORDER — BENZONATATE 200 MG PO CAPS: 200.0000 mg | ORAL_CAPSULE | Freq: Two times a day (BID) | ORAL | 0 refills | Status: DC | PRN

## 2020-07-06 MED ORDER — AZITHROMYCIN 250 MG PO TABS: ORAL_TABLET | ORAL | 0 refills | Status: AC

## 2020-07-06 MED ORDER — PREDNISONE 20 MG PO TABS: 40.0000 mg | ORAL_TABLET | Freq: Every day | ORAL | 0 refills | Status: DC

## 2020-07-06 NOTE — Progress Notes (Signed)
Virtual Visit via telephone Note  This visit type was conducted due to national recommendations for restrictions regarding the COVID-19 pandemic (e.g. social distancing).  This format is felt to be most appropriate for this patient at this time.  All issues noted in this document were discussed and addressed.  No physical exam was performed (except for noted visual exam findings with Video Visits).   I connected with Tammy Gregory today at 11:30 AM EDT by telephone and verified that I am speaking with the correct person using two identifiers. Location patient: home Location provider: work  Persons participating in the virtual visit: patient, provider  I discussed the limitations, risks, security and privacy concerns of performing an evaluation and management service by telephone and the availability of in person appointments. I also discussed with the patient that there may be a patient responsible charge related to this service. The patient expressed understanding and agreed to proceed.  Interactive audio and video telecommunications were attempted between this provider and patient, however failed, due to patient having technical difficulties OR patient did not have access to video capability.  We continued and completed visit with audio only.   Reason for visit: same day visit  HPI: Cough: Patient notes she started with a bad cough 3 to 4 days ago.  It is a dry cough.  She feels as though she is wheezing.  There is chest congestion.  She also notes some head congestion.  No fevers.  Mild sore throat.  Some shortness of breath with exertion though not at rest.  No chest pain.  No taste or smell disturbances.  No COVID exposures.  She has received 2 COVID vaccines.  She took an at home COVID test that she reports is negative.  She has been using an albuterol inhaler 2-3 times a day and an albuterol nebulizer on 1 occasion both of which seem to be beneficial.  ROS: See pertinent positives  and negatives per HPI.  Past Medical History:  Diagnosis Date  . Chicken pox   . Headache   . Hypertension     Past Surgical History:  Procedure Laterality Date  . CERVICAL FUSION  12/03/2014  . CESAREAN SECTION     3  . COLONOSCOPY WITH PROPOFOL N/A 04/02/2015   Procedure: COLONOSCOPY WITH PROPOFOL;  Surgeon: Lollie Sails, MD;  Location: [facility] ENDOSCOPY;  Service: Endoscopy;  Laterality: N/A;    Family History  Problem Relation Age of Onset  . Hypertension Father   . Hypertension Mother   . Breast cancer Cousin        2 mat cousins    SOCIAL HX: Non-smoker   Current Outpatient Medications:  .  amLODipine (NORVASC) 5 MG tablet, TAKE 1 TABLET BY MOUTH EVERY DAY, Disp: 90 tablet, Rfl: 3 .  azithromycin (ZITHROMAX) 250 MG tablet, Take 2 tablets on day 1, then 1 tablet daily on days 2 through 5, Disp: 6 tablet, Rfl: 0 .  benzonatate (TESSALON) 200 MG capsule, Take 1 capsule (200 mg total) by mouth 2 (two) times daily as needed for cough., Disp: 20 capsule, Rfl: 0 .  escitalopram (LEXAPRO) 10 MG tablet, TAKE 1 TABLET BY MOUTH EVERY DAY, Disp: 90 tablet, Rfl: 1 .  gabapentin (NEURONTIN) 300 MG capsule, Take 1 capsule by mouth at bedtime., Disp: , Rfl:  .  ibuprofen (ADVIL) 600 MG tablet, Take 600 mg by mouth every 6 (six) hours as needed., Disp: , Rfl:  .  ID NOW COVID-19 KIT, See  admin instructions., Disp: , Rfl:  .  Melatonin 10 MG TABS, Take by mouth., Disp: , Rfl:  .  meloxicam (MOBIC) 15 MG tablet, TAKE 1 TABLET BY MOUTH EVERY DAY AS NEEDED FOR PAIN, Disp: 30 tablet, Rfl: 2 .  moxifloxacin (VIGAMOX) 0.5 % ophthalmic solution, Apply to eye., Disp: , Rfl:  .  oxyCODONE (OXY IR/ROXICODONE) 5 MG immediate release tablet, Take by mouth., Disp: , Rfl:  .  predniSONE (DELTASONE) 20 MG tablet, Take 2 tablets (40 mg total) by mouth daily with breakfast., Disp: 10 tablet, Rfl: 0 .  traMADol (ULTRAM) 50 MG tablet, Take 50-100 mg by mouth 4 (four) times daily as needed., Disp: , Rfl:   .  tranexamic acid (LYSTEDA) 650 MG TABS tablet, Take 650 mg by mouth daily., Disp: , Rfl:  .  erythromycin ophthalmic ointment, 4 (four) times daily., Disp: , Rfl:  .  valACYclovir (VALTREX) 1000 MG tablet, Take 1 tablet (1,000 mg total) by mouth 3 (three) times daily. (Patient not taking: Reported on 07/06/2020), Disp: 21 tablet, Rfl: 0  EXAM: This was a telephone visit and thus no physical exam was completed.  ASSESSMENT AND PLAN:  Discussed the following assessment and plan:  Problem List Items Addressed This Visit    Cough    Concern for COVID versus viral respiratory illness versus bronchitis.  We will have her tested for COVID-19.  She will stay quarantined at home until we get this test result back.  She reports she has an appointment later today with orthopedics and I advised that she contact them and let them know that she is being tested for COVID-19 to see if she can convert this to a virtual visit or reschedule it.  Given her wheezing we will proceed with prednisone 40 mg once daily for 5 days.  We will also prescribe azithromycin 500 mg on the first day and 250 mg on days 2 through 5.  She can use Tessalon for cough.  If she develops worsening shortness of breath, chest pain, hemoptysis, elevated fevers, or worsening symptoms she should be evaluated in person.      Relevant Medications   predniSONE (DELTASONE) 20 MG tablet   benzonatate (TESSALON) 200 MG capsule   azithromycin (ZITHROMAX) 250 MG tablet   Other Relevant Orders   Novel Coronavirus, NAA ([facility])       I discussed the assessment and treatment plan with the patient. The patient was provided an opportunity to ask questions and all were answered. The patient agreed with the plan and demonstrated an understanding of the instructions.   The patient was advised to call back or seek an in-person evaluation if the symptoms worsen or if the condition fails to improve as anticipated.  I provided 9 minutes of  non-face-to-face time during this encounter.   Tommi Rumps, MD

## 2020-07-06 NOTE — Assessment & Plan Note (Signed)
Concern for COVID versus viral respiratory illness versus bronchitis.  We will have her tested for COVID-19.  She will stay quarantined at home until we get this test result back.  She reports she has an appointment later today with orthopedics and I advised that she contact them and let them know that she is being tested for COVID-19 to see if she can convert this to a virtual visit or reschedule it.  Given her wheezing we will proceed with prednisone 40 mg once daily for 5 days.  We will also prescribe azithromycin 500 mg on the first day and 250 mg on days 2 through 5.  She can use Tessalon for cough.  If she develops worsening shortness of breath, chest pain, hemoptysis, elevated fevers, or worsening symptoms she should be evaluated in person.

## 2020-07-07 LAB — NOVEL CORONAVIRUS, NAA: SARS-CoV-2, NAA: NOT DETECTED

## 2020-07-09 ENCOUNTER — Other Ambulatory Visit: Payer: Self-pay

## 2020-07-09 ENCOUNTER — Other Ambulatory Visit: Payer: Self-pay | Admitting: Family Medicine

## 2020-07-09 MED ORDER — ALBUTEROL SULFATE HFA 108 (90 BASE) MCG/ACT IN AERS
2.0000 | INHALATION_SPRAY | Freq: Four times a day (QID) | RESPIRATORY_TRACT | 0 refills | Status: DC | PRN
Start: 2020-07-09 — End: 2021-01-24

## 2020-08-04 DIAGNOSIS — M1711 Unilateral primary osteoarthritis, right knee: Secondary | ICD-10-CM | POA: Diagnosis not present

## 2020-08-05 DIAGNOSIS — Z124 Encounter for screening for malignant neoplasm of cervix: Secondary | ICD-10-CM | POA: Diagnosis not present

## 2020-08-05 DIAGNOSIS — Z01419 Encounter for gynecological examination (general) (routine) without abnormal findings: Secondary | ICD-10-CM | POA: Diagnosis not present

## 2020-08-05 DIAGNOSIS — Z713 Dietary counseling and surveillance: Secondary | ICD-10-CM | POA: Diagnosis not present

## 2020-09-23 ENCOUNTER — Other Ambulatory Visit: Payer: Self-pay | Admitting: Family Medicine

## 2020-10-15 ENCOUNTER — Other Ambulatory Visit: Payer: Self-pay | Admitting: Family Medicine

## 2020-10-18 DIAGNOSIS — Z713 Dietary counseling and surveillance: Secondary | ICD-10-CM | POA: Diagnosis not present

## 2020-12-02 ENCOUNTER — Other Ambulatory Visit: Payer: Self-pay | Admitting: Family Medicine

## 2020-12-22 DIAGNOSIS — Z713 Dietary counseling and surveillance: Secondary | ICD-10-CM | POA: Diagnosis not present

## 2021-01-02 ENCOUNTER — Other Ambulatory Visit: Payer: Self-pay | Admitting: Family Medicine

## 2021-01-03 DIAGNOSIS — M170 Bilateral primary osteoarthritis of knee: Secondary | ICD-10-CM | POA: Diagnosis not present

## 2021-01-23 ENCOUNTER — Other Ambulatory Visit: Payer: Self-pay | Admitting: Family Medicine

## 2021-02-15 DIAGNOSIS — G5603 Carpal tunnel syndrome, bilateral upper limbs: Secondary | ICD-10-CM | POA: Diagnosis not present

## 2021-02-16 ENCOUNTER — Other Ambulatory Visit: Payer: Self-pay | Admitting: Family Medicine

## 2021-02-16 DIAGNOSIS — M4802 Spinal stenosis, cervical region: Secondary | ICD-10-CM | POA: Diagnosis not present

## 2021-02-16 DIAGNOSIS — Z6834 Body mass index (BMI) 34.0-34.9, adult: Secondary | ICD-10-CM | POA: Diagnosis not present

## 2021-02-16 DIAGNOSIS — I10 Essential (primary) hypertension: Secondary | ICD-10-CM | POA: Diagnosis not present

## 2021-03-08 ENCOUNTER — Other Ambulatory Visit: Payer: Self-pay

## 2021-03-08 ENCOUNTER — Ambulatory Visit (INDEPENDENT_AMBULATORY_CARE_PROVIDER_SITE_OTHER): Payer: Federal, State, Local not specified - PPO

## 2021-03-08 ENCOUNTER — Encounter: Payer: Self-pay | Admitting: Family

## 2021-03-08 ENCOUNTER — Ambulatory Visit (INDEPENDENT_AMBULATORY_CARE_PROVIDER_SITE_OTHER): Payer: Federal, State, Local not specified - PPO | Admitting: Family

## 2021-03-08 VITALS — BP 120/70 | HR 67 | Temp 97.4°F | Ht 62.0 in | Wt 190.4 lb

## 2021-03-08 DIAGNOSIS — M25512 Pain in left shoulder: Secondary | ICD-10-CM

## 2021-03-08 DIAGNOSIS — M19012 Primary osteoarthritis, left shoulder: Secondary | ICD-10-CM | POA: Diagnosis not present

## 2021-03-08 DIAGNOSIS — Z981 Arthrodesis status: Secondary | ICD-10-CM | POA: Diagnosis not present

## 2021-03-08 DIAGNOSIS — W010XXA Fall on same level from slipping, tripping and stumbling without subsequent striking against object, initial encounter: Secondary | ICD-10-CM

## 2021-03-08 DIAGNOSIS — M5412 Radiculopathy, cervical region: Secondary | ICD-10-CM | POA: Diagnosis not present

## 2021-03-08 MED ORDER — PREDNISONE 20 MG PO TABS: 40.0000 mg | ORAL_TABLET | Freq: Every day | ORAL | 0 refills | Status: DC

## 2021-03-08 MED ORDER — GABAPENTIN 300 MG PO CAPS: 300.0000 mg | ORAL_CAPSULE | Freq: Every day | ORAL | 1 refills | Status: DC

## 2021-03-09 NOTE — Progress Notes (Signed)
Acute Office Visit  Subjective:    Patient ID: Tammy Gregory, female    DOB: [birth year], 61 y.o.   MRN: [id number]  Chief Complaint  Patient presents with   Acute Visit    Neck pain  after fall    HPI Patient is in today with c/o severe neck and left shoulder pain that has worsened over that last several days. Patient reports when she fell, she hit her head, broke her teeth and glasses. She admits to a fall last month where she tripped over an uneven surface, extended her hands to break her fall, and 1 week later she developed pain in her neck and shoulder Pain is 10/10. Patient reports calling her neuro-surgeon who did an xray about 3 weeks ago and her neck xray was normal  She has a history of cervical fusion in 2016. However, she continues to have pain. Pain is described as a deep, achy and burning pain. She has been applying ice/cold packs, icy hot, taking meloxicam and Gabapentin that helps some. Pain is worse at night.   Past Medical History:  Diagnosis Date   Chicken pox    Headache    Hypertension     Past Surgical History:  Procedure Laterality Date   CERVICAL FUSION  12/03/2014   CESAREAN SECTION     3   COLONOSCOPY WITH PROPOFOL N/A 04/02/2015   Procedure: COLONOSCOPY WITH PROPOFOL;  Surgeon: Lollie Sails, MD;  [facility] ENDOSCOPY;  Service: Endoscopy;  Laterality: N/A;    Family History  Problem Relation Age of Onset   Hypertension Father    Hypertension Mother    Breast cancer Cousin        2 mat cousins    Social History   Socioeconomic History   Marital status: Married    Spouse name: Not on file   Number of children: Not on file   Years of education: Not on file   Highest education level: Not on file  Occupational History   Not on file  Tobacco Use   Smoking status: Never   Smokeless tobacco: Never  Vaping Use   Vaping Use: Never used  Substance and Sexual Activity   Alcohol use: Yes    Comment: occ   Drug use: No   Sexual  activity: Not on file  Other Topics Concern   Not on file  Social History Narrative   Not on file   Social Determinants of Health   Financial Resource Strain: Not on file  Food Insecurity: Not on file  Transportation Needs: Not on file  Physical Activity: Not on file  Stress: Not on file  Social Connections: Not on file  Intimate Partner Violence: Not on file    Outpatient Medications Prior to Visit  Medication Sig Dispense Refill   albuterol (VENTOLIN HFA) 108 (90 Base) MCG/ACT inhaler TAKE 2 PUFFS BY MOUTH EVERY 6 HOURS AS NEEDED FOR WHEEZE OR SHORTNESS OF BREATH 8.5 each 0   amLODipine (NORVASC) 5 MG tablet TAKE 1 TABLET BY MOUTH EVERY DAY 90 tablet 3   benzonatate (TESSALON) 200 MG capsule Take 1 capsule (200 mg total) by mouth 2 (two) times daily as needed for cough. 20 capsule 0   erythromycin ophthalmic ointment 4 (four) times daily.     escitalopram (LEXAPRO) 10 MG tablet TAKE 1 TABLET BY MOUTH EVERY DAY 90 tablet 1   ibuprofen (ADVIL) 600 MG tablet Take 600 mg by mouth every 6 (six) hours as needed.  ID NOW COVID-19 KIT See admin instructions.     Melatonin 10 MG TABS Take by mouth.     meloxicam (MOBIC) 15 MG tablet TAKE 1 TABLET BY MOUTH EVERY DAY AS NEEDED FOR PAIN 30 tablet 2   methocarbamol (ROBAXIN) 500 MG tablet Take 1 tablet by mouth 4 (four) times daily.     moxifloxacin (VIGAMOX) 0.5 % ophthalmic solution Apply to eye.     oxyCODONE (OXY IR/ROXICODONE) 5 MG immediate release tablet Take by mouth.     traMADol (ULTRAM) 50 MG tablet Take 50-100 mg by mouth 4 (four) times daily as needed.     tranexamic acid (LYSTEDA) 650 MG TABS tablet Take 650 mg by mouth daily.     valACYclovir (VALTREX) 1000 MG tablet Take 1 tablet (1,000 mg total) by mouth 3 (three) times daily. 21 tablet 0   gabapentin (NEURONTIN) 300 MG capsule Take 1 capsule by mouth at bedtime.     predniSONE (DELTASONE) 20 MG tablet Take 2 tablets (40 mg total) by mouth daily with breakfast. 10 tablet 0    No facility-administered medications prior to visit.    Allergies  Allergen Reactions   Codeine Nausea Only, Other (See Comments) and Nausea And Vomiting    Other Reaction: DIZZINESS.   Amoxicillin Diarrhea   Clindamycin Other (See Comments)    Review of Systems  Respiratory: Negative.    Cardiovascular: Negative.   Musculoskeletal:  Positive for neck pain.       Left shoulder pain  Skin: Negative.   Neurological:  Negative for dizziness, light-headedness and numbness.       Neck pain, burning and aching   Psychiatric/Behavioral: Negative.    All other systems reviewed and are negative.     Objective:    Physical Exam Vitals and nursing note reviewed.  Constitutional:      Appearance: Normal appearance.  Neck:     Comments: Pain with rotation of the neck. Pain with flexion of the neck. No pain with hyperextension. Negative empty can test Cardiovascular:     Rate and Rhythm: Normal rate and regular rhythm.  Pulmonary:     Effort: Pulmonary effort is normal.     Breath sounds: Normal breath sounds.  Musculoskeletal:     Cervical back: Neck supple.     Comments: Left shoulder: tender to palpation posteriorly.   Skin:    General: Skin is warm and dry.  Neurological:     General: No focal deficit present.     Mental Status: She is alert and oriented to person, place, and time.  Psychiatric:        Mood and Affect: Mood normal.        Behavior: Behavior normal.    BP 120/70 (BP Location: Right Arm, Patient Position: Sitting, Cuff Size: Normal)    Pulse 67    Temp (!) 97.4 F (36.3 C) (Oral)    Ht 5' 2"  (1.575 m)    Wt 190 lb 6.4 oz (86.4 kg)    LMP 04/02/2007 (Approximate)    SpO2 97%    BMI 34.82 kg/m  Wt Readings from Last 3 Encounters:  03/08/21 190 lb 6.4 oz (86.4 kg)  07/06/20 210 lb (95.3 kg)  12/12/19 201 lb (91.2 kg)    Health Maintenance Due  Topic Date Due   Zoster Vaccines- Shingrix (1 of 2) Never done   COVID-19 Vaccine (3 - Booster for Pfizer  series) 05/13/2019   PAP SMEAR-Modifier  07/10/2020   INFLUENZA VACCINE  10/04/2020    There are no preventive care reminders to display for this patient.   Lab Results  Component Value Date   TSH 2.29 06/25/2019   Lab Results  Component Value Date   WBC 5.1 08/10/2017   HGB 14.2 08/10/2017   HCT 42.5 08/10/2017   MCV 89.6 08/10/2017   PLT 185.0 08/10/2017   Lab Results  Component Value Date   NA 141 06/25/2019   K 3.9 06/25/2019   CO2 28 06/25/2019   GLUCOSE 106 (H) 06/25/2019   BUN 18 06/25/2019   CREATININE 0.87 06/25/2019   BILITOT 0.7 06/25/2019   ALKPHOS 71 06/25/2019   AST 19 06/25/2019   ALT 19 06/25/2019   PROT 6.8 06/25/2019   ALBUMIN 4.2 06/25/2019   CALCIUM 9.1 06/25/2019   ANIONGAP 4 (L) 03/01/2016   GFR 66.65 06/25/2019   Lab Results  Component Value Date   CHOL 172 05/01/2019   Lab Results  Component Value Date   HDL 58.70 05/01/2019   Lab Results  Component Value Date   LDLCALC 94 05/01/2019   Lab Results  Component Value Date   TRIG 100.0 05/01/2019   Lab Results  Component Value Date   CHOLHDL 3 05/01/2019   Lab Results  Component Value Date   HGBA1C 5.1 05/01/2019       Assessment & Plan:   Problem List Items Addressed This Visit   None Visit Diagnoses     Cervical radiculopathy    -  Primary   Relevant Medications   gabapentin (NEURONTIN) 300 MG capsule   Other Relevant Orders   DG Shoulder Left (Completed)   CT CERVICAL SPINE W CONTRAST   Fall on same level from slipping, tripping or stumbling, initial encounter       Relevant Orders   CT CERVICAL SPINE W CONTRAST   Acute pain of left shoulder       Relevant Orders   DG Shoulder Left (Completed)        Meds ordered this encounter  Medications   predniSONE (DELTASONE) 20 MG tablet    Sig: Take 2 tablets (40 mg total) by mouth daily with breakfast.    Dispense:  10 tablet    Refill:  0   gabapentin (NEURONTIN) 300 MG capsule    Sig: Take 1 capsule (300 mg  total) by mouth at bedtime.    Dispense:  90 capsule    Refill:  1   CT of the c-spine ordered. Prednisone ordered. Call the office if symptoms worsen or persist. Recheck as scheduled as needed.   Kennyth Arnold, FNP

## 2021-03-29 ENCOUNTER — Other Ambulatory Visit: Payer: Self-pay | Admitting: Family

## 2021-03-29 DIAGNOSIS — M5412 Radiculopathy, cervical region: Secondary | ICD-10-CM

## 2021-03-29 DIAGNOSIS — W010XXA Fall on same level from slipping, tripping and stumbling without subsequent striking against object, initial encounter: Secondary | ICD-10-CM

## 2021-03-30 ENCOUNTER — Other Ambulatory Visit: Payer: Self-pay | Admitting: Family

## 2021-03-30 DIAGNOSIS — M5412 Radiculopathy, cervical region: Secondary | ICD-10-CM

## 2021-04-08 ENCOUNTER — Telehealth: Payer: Self-pay | Admitting: Family Medicine

## 2021-04-08 NOTE — Telephone Encounter (Signed)
Rasheedah called and spoke with the patient.  Mildreth Reek,cma

## 2021-04-08 NOTE — Telephone Encounter (Signed)
Pt called in stating that she never received a call about her appt for her CT/MRI/Imaging. Pt stated that she is in a lot of pain. Try to transfer Pt to referral coordinator. Pt refused to be transfer. Pt stated that she spoke with referral coordinator. Pt stated she would like to speak with Dr. Caryl Bis or you. Pt requesting callback.

## 2021-04-11 ENCOUNTER — Other Ambulatory Visit: Payer: Self-pay | Admitting: Family

## 2021-04-11 DIAGNOSIS — M5412 Radiculopathy, cervical region: Secondary | ICD-10-CM

## 2021-04-11 DIAGNOSIS — W010XXA Fall on same level from slipping, tripping and stumbling without subsequent striking against object, initial encounter: Secondary | ICD-10-CM

## 2021-04-15 NOTE — Telephone Encounter (Addendum)
Pt wants an update on referral. She is in a lot of pain. Pt wants to be called after 2 today

## 2021-04-15 NOTE — Telephone Encounter (Signed)
I spoke with pt to inform about the referral to [facility] neurology not being able to accept pt. Pt requested her provider order a CT. Message was sent to provider.

## 2021-04-15 NOTE — Telephone Encounter (Signed)
Tammy Gregory is calling the patient back. Tammy Gregory,cma

## 2021-04-19 ENCOUNTER — Telehealth: Payer: Self-pay

## 2021-04-19 NOTE — Telephone Encounter (Signed)
Please contact the patient and see what symptoms she is specifically having at this time. I am a little unclear as to why she would need a CT scan given the diagnosis that is listed for the scan. An MRI might be a better option. It also might be a good idea to get her in to see me so I can figure out what the best imaging might be for her. Thanks.

## 2021-04-19 NOTE — Telephone Encounter (Signed)
-----   Message from Ashley Jacobs sent at 04/15/2021 12:20 PM EST ----- Regarding: referral to neurology Good afternoon!  I received message from [facility] stating:  From: Cameron Sprang, MD Sent: 04/15/2021 10:00 AM EST To: [facility]  We do not treat pain after fall, agree with CT cervical spine and f/u with her neurosurgeon, thanks  I spoke with pt to inform of the referral.   Dr Caryl Bis: can you please place a referral/order for pt to have a Ct cervical spine without contrast or which you prefer with contrast. Please advise and Thank you!

## 2021-04-20 DIAGNOSIS — M4802 Spinal stenosis, cervical region: Secondary | ICD-10-CM | POA: Diagnosis not present

## 2021-04-20 DIAGNOSIS — M47812 Spondylosis without myelopathy or radiculopathy, cervical region: Secondary | ICD-10-CM | POA: Diagnosis not present

## 2021-04-20 DIAGNOSIS — M5021 Other cervical disc displacement,  high cervical region: Secondary | ICD-10-CM | POA: Diagnosis not present

## 2021-04-20 NOTE — Telephone Encounter (Signed)
I do not see where she had an MRI. Where was this done and who ordered it? She may need to contact the ordering provider for the results.

## 2021-04-20 NOTE — Telephone Encounter (Signed)
I called and spoke with the patient and she stated she had the MRI today and she wants the results of that and after the results she will schedule a visit with the provider.  Tammy Gregory,cma

## 2021-04-21 NOTE — Telephone Encounter (Signed)
Noted  

## 2021-04-21 NOTE — Telephone Encounter (Signed)
I called and spoke with the patient and she stated her marrow Surgeon Dr. Mallie Mussel pool e ordered the MRI and she is awaiting results and he will fax the results to you as well.  Renatta Shrieves,cma

## 2021-04-27 DIAGNOSIS — M5412 Radiculopathy, cervical region: Secondary | ICD-10-CM | POA: Diagnosis not present

## 2021-05-11 DIAGNOSIS — Z713 Dietary counseling and surveillance: Secondary | ICD-10-CM | POA: Diagnosis not present

## 2021-05-19 DIAGNOSIS — M5412 Radiculopathy, cervical region: Secondary | ICD-10-CM | POA: Diagnosis not present

## 2021-05-19 DIAGNOSIS — I10 Essential (primary) hypertension: Secondary | ICD-10-CM | POA: Diagnosis not present

## 2021-05-19 DIAGNOSIS — M4802 Spinal stenosis, cervical region: Secondary | ICD-10-CM | POA: Diagnosis not present

## 2021-05-19 DIAGNOSIS — Z6834 Body mass index (BMI) 34.0-34.9, adult: Secondary | ICD-10-CM | POA: Diagnosis not present

## 2021-05-23 ENCOUNTER — Other Ambulatory Visit: Payer: Self-pay | Admitting: Neurosurgery

## 2021-05-31 DIAGNOSIS — N6310 Unspecified lump in the right breast, unspecified quadrant: Secondary | ICD-10-CM | POA: Diagnosis not present

## 2021-05-31 DIAGNOSIS — N63 Unspecified lump in unspecified breast: Secondary | ICD-10-CM | POA: Diagnosis not present

## 2021-05-31 DIAGNOSIS — R922 Inconclusive mammogram: Secondary | ICD-10-CM | POA: Diagnosis not present

## 2021-06-03 NOTE — Progress Notes (Signed)
Surgical Instructions ? ? ? Your procedure is scheduled on 06/09/21. ? Report to [facility] Main Entrance "A" at 6:00 A.M., then check in with the Admitting office. ? Call this number if you have problems the morning of surgery: ? [phone] ? ? If you have any questions prior to your surgery date call [phone]: Open Monday-Friday 8am-4pm ? ? ? Remember: ? Do not eat or drink after midnight the night before your surgery ? ?  ? Take these medicines the morning of surgery with A SIP OF WATER:  ?amLODipine (NORVASC)  ? ?IF NEEDED: ?albuterol (VENTOLIN HFA) ? ?PLEASE STOP TAKING Phentermine-Topiramate 2 WEEKS PRIOR TO SURGERY. ? ?As of today, STOP taking any Aspirin (unless otherwise instructed by your surgeon) Aleve, Naproxen, Ibuprofen, Motrin, Advil, Goody's, BC's, all herbal medications, fish oil, meloxicam (MOBIC) and all vitamins. ? ?WHAT DO I DO ABOUT MY DIABETES MEDICATION? ? ? ?Do not take oral diabetes medicines (pills) the morning of surgery. ? ?THE MORNING OF SURGERY, do not take metFORMIN (GLUCOPHAGE-XR). ? ?The day of surgery, do not take other diabetes injectables, including Byetta (exenatide), Bydureon (exenatide ER), Victoza (liraglutide), or Trulicity (dulaglutide). ? ?If your CBG is greater than 220 mg/dL, you may take ? of your sliding scale (correction) dose of insulin. ? ? ?HOW TO MANAGE YOUR DIABETES ?BEFORE AND AFTER SURGERY ? ?Why is it important to control my blood sugar before and after surgery? ?Improving blood sugar levels before and after surgery helps healing and can limit problems. ?A way of improving blood sugar control is eating a healthy diet by: ? Eating less sugar and carbohydrates ? Increasing activity/exercise ? Talking with your doctor about reaching your blood sugar goals ?High blood sugars (greater than 180 mg/dL) can raise your risk of infections and slow your recovery, so you will need to focus on controlling your diabetes during the weeks before surgery. ?Make sure that  the doctor who takes care of your diabetes knows about your planned surgery including the date and location. ? ?How do I manage my blood sugar before surgery? ?Check your blood sugar at least 4 times a day, starting 2 days before surgery, to make sure that the level is not too high or low. ? ?Check your blood sugar the morning of your surgery when you wake up and every 2 hours until you get to the Short Stay unit. ? ?If your blood sugar is less than 70 mg/dL, you will need to treat for low blood sugar: ?Do not take insulin. ?Treat a low blood sugar (less than 70 mg/dL) with ? cup of clear juice (cranberry or apple), 4 glucose tablets, OR glucose gel. ?Recheck blood sugar in 15 minutes after treatment (to make sure it is greater than 70 mg/dL). If your blood sugar is not greater than 70 mg/dL on recheck, call [phone] for further instructions. ?Report your blood sugar to the short stay nurse when you get to Short Stay. ? ?If you are admitted to the hospital after surgery: ?Your blood sugar will be checked by the staff and you will probably be given insulin after surgery (instead of oral diabetes medicines) to make sure you have good blood sugar levels. ?The goal for blood sugar control after surgery is 80-180 mg/dL. ? ?         ?Do not wear jewelry or makeup ?Do not wear lotions, powders, perfumes/colognes, or deodorant. ?Do not shave 48 hours prior to surgery.   ?Do not bring valuables to the hospital. ?Do not  wear nail polish, gel polish, artificial nails, or any other type of covering on natural nails (fingers and toes) ?If you have artificial nails or gel coating that need to be removed by a nail salon, please have this removed prior to surgery. Artificial nails or gel coating may interfere with anesthesia's ability to adequately monitor your vital signs. ? ?[facility] is not responsible for any belongings or valuables. .  ? ?Do NOT Smoke (Tobacco/Vaping)  24 hours prior to your procedure ? ?If you use a  CPAP at night, you may bring your mask for your overnight stay. ?  ?Contacts, glasses, hearing aids, dentures or partials may not be worn into surgery, please bring cases for these belongings ?  ?For patients admitted to the hospital, discharge time will be determined by your treatment team. ?  ?Patients discharged the day of surgery will not be allowed to drive home, and someone needs to stay with them for 24 hours. ? ? ?SURGICAL WAITING ROOM VISITATION ?Patients having surgery or a procedure in a hospital may have two support people. ?Children under the age of 71 must have an adult with them who is not the patient. ?They may stay in the waiting area during the procedure and may switch out with other visitors. If the patient needs to stay at the hospital during part of their recovery, the visitor guidelines for inpatient rooms apply. ? ?Please refer to the [facility] website for the visitor guidelines for Inpatients (after your surgery is over and you are in a regular room).  ? ? ? ? ? ?Special instructions:   ? ?Oral Hygiene is also important to reduce your risk of infection.  Remember - BRUSH YOUR TEETH THE MORNING OF SURGERY WITH YOUR REGULAR TOOTHPASTE ? ? ?[facility]- Preparing For Surgery ? ?Before surgery, you can play an important role. Because skin is not sterile, your skin needs to be as free of germs as possible. You can reduce the number of germs on your skin by washing with CHG (chlorahexidine gluconate) Soap before surgery.  CHG is an antiseptic cleaner which kills germs and bonds with the skin to continue killing germs even after washing.   ? ? ?Please do not use if you have an allergy to CHG or antibacterial soaps. If your skin becomes reddened/irritated stop using the CHG.  ?Do not shave (including legs and underarms) for at least 48 hours prior to first CHG shower. It is OK to shave your face. ? ?Please follow these instructions carefully. ?  ? ? Shower the NIGHT BEFORE SURGERY and the MORNING OF  SURGERY with CHG Soap.  ? If you chose to wash your hair, wash your hair first as usual with your normal shampoo. After you shampoo, rinse your hair and body thoroughly to remove the shampoo.  Then [employer] and genitals (private parts) with your normal soap and rinse thoroughly to remove soap. ? ?After that Use CHG Soap as you would any other liquid soap. You can apply CHG directly to the skin and wash gently with a scrungie or a clean washcloth.  ? ?Apply the CHG Soap to your body ONLY FROM THE NECK DOWN.  Do not use on open wounds or open sores. Avoid contact with your eyes, ears, mouth and genitals (private parts). Wash Face and genitals (private parts)  with your normal soap.  ? ?Wash thoroughly, paying special attention to the area where your surgery will be performed. ? ?Thoroughly rinse your body with warm water  from the neck down. ? ?DO NOT shower/wash with your normal soap after using and rinsing off the CHG Soap. ? ?Pat yourself dry with a CLEAN TOWEL. ? ?Wear CLEAN PAJAMAS to bed the night before surgery ? ?Place CLEAN SHEETS on your bed the night before your surgery ? ?DO NOT SLEEP WITH PETS. ? ? ?Day of Surgery: ?Take a shower with CHG soap. ?Wear Clean/Comfortable clothing the morning of surgery ?Do not apply any deodorants/lotions.   ?Remember to brush your teeth WITH YOUR REGULAR TOOTHPASTE. ? ? ? ?If you received a COVID test during your pre-op visit  it is requested that you wear a mask when out in public, stay away from anyone that may not be feeling well and notify your surgeon if you develop symptoms. If you have been in contact with anyone that has tested positive in the last 10 days please notify you surgeon. ? ?  ?Please read over the following fact sheets that you were given.  ? ?

## 2021-06-06 ENCOUNTER — Other Ambulatory Visit: Payer: Self-pay

## 2021-06-06 ENCOUNTER — Encounter (HOSPITAL_COMMUNITY): Payer: Self-pay

## 2021-06-06 ENCOUNTER — Encounter (HOSPITAL_COMMUNITY)
Admission: RE | Admit: 2021-06-06 | Discharge: 2021-06-06 | Disposition: A | Discharge status: Home / Self Care | Payer: Federal, State, Local not specified - PPO | Source: Ambulatory Visit | Attending: Neurosurgery | Admitting: Neurosurgery

## 2021-06-06 VITALS — BP 136/79 | HR 74 | Temp 97.5°F | Resp 18 | Ht 62.0 in | Wt 182.3 lb

## 2021-06-06 DIAGNOSIS — I10 Essential (primary) hypertension: Secondary | ICD-10-CM | POA: Insufficient documentation

## 2021-06-06 DIAGNOSIS — Z6833 Body mass index (BMI) 33.0-33.9, adult: Secondary | ICD-10-CM | POA: Insufficient documentation

## 2021-06-06 DIAGNOSIS — Z01818 Encounter for other preprocedural examination: Secondary | ICD-10-CM | POA: Diagnosis not present

## 2021-06-06 DIAGNOSIS — E669 Obesity, unspecified: Secondary | ICD-10-CM | POA: Diagnosis not present

## 2021-06-06 DIAGNOSIS — M4803 Spinal stenosis, cervicothoracic region: Secondary | ICD-10-CM | POA: Diagnosis not present

## 2021-06-06 HISTORY — DX: Anxiety disorder, unspecified: F41.9

## 2021-06-06 LAB — CBC
HCT: 46.3 % — ABNORMAL HIGH (ref 36.0–46.0)
Hemoglobin: 14.8 g/dL (ref 12.0–15.0)
MCH: 30 pg (ref 26.0–34.0)
MCHC: 32 g/dL (ref 30.0–36.0)
MCV: 93.9 fL (ref 80.0–100.0)
Platelets: 182 10*3/uL (ref 150–400)
RBC: 4.93 MIL/uL (ref 3.87–5.11)
RDW: 12.7 % (ref 11.5–15.5)
WBC: 6 10*3/uL (ref 4.0–10.5)
nRBC: 0 % (ref 0.0–0.2)

## 2021-06-06 LAB — SURGICAL PCR SCREEN
MRSA, PCR: NEGATIVE
Staphylococcus aureus: NEGATIVE

## 2021-06-06 LAB — BASIC METABOLIC PANEL
Anion gap: 4 — ABNORMAL LOW (ref 5–15)
BUN: 19 mg/dL (ref 6–20)
CO2: 25 mmol/L (ref 22–32)
Calcium: 9.1 mg/dL (ref 8.9–10.3)
Chloride: 112 mmol/L — ABNORMAL HIGH (ref 98–111)
Creatinine, Ser: 1.01 mg/dL — ABNORMAL HIGH (ref 0.44–1.00)
GFR, Estimated: >60 mL/min (ref >60)
Glucose, Bld: 92 mg/dL (ref 70–99)
Potassium: 4.4 mmol/L (ref 3.5–5.1)
Sodium: 141 mmol/L (ref 135–145)

## 2021-06-06 NOTE — Progress Notes (Signed)
PCP - Dr. Caryl Bis ? ?Cardiologist - Denies ? ?EP- Denies ? ?Endocrine- Denies ? ?Pulm- Denies ? ?Chest x-ray - Denies ? ?EKG - 06/06/21 ? ?Stress Test - Denies ? ?ECHO - Denies ? ?Cardiac Cath - Denies ? ?AICD-na ?PM-na ?LOOP-na ?  ?Nerve Stimulator- Denies ? ?Dialysis- Denies ? ?Sleep Study - Denies ?CPAP - Denies ? ?LABS- 06/06/21: CBC, BMP, PCR ? ?ASA- Denies ? ?ERAS- No ? ?HA1C- Denies- Pt takes Metformin for weight loss  ? ?Anesthesia- Yes- EKG ? ?Pt denies having chest pain, sob, or fever at this time. All instructions explained to the pt, with a verbal understanding of the material. Pt agrees to go over the instructions while at home for a better understanding. Pt also instructed to wear a mask and social distance if she goes out. The opportunity to ask questions was provided.  ?

## 2021-06-07 NOTE — Progress Notes (Signed)
Anesthesia Chart Review: ? Case: [id number] Date/Time: 06/09/21 0745  ? Procedure: ACDF - C7-T1  ? Anesthesia type: General  ? Pre-op diagnosis: Stenosis  ? Location: [facility] OR ROOM 19 / [facility] OR  ? Surgeons: Earnie Larsson, MD  ? ?  ? ? ?DISCUSSION: Patient is a 61 year old female scheduled for the above procedure. ? ?History includes never smoker, HTN, c-spine surgery (C4-7 ACDF 2016). She is on metformin as part of her weight loss medications, not due to diabetes or hyperglycemia. BMI is consistent with obesity. ? ?Last GYN visit with Felton Clinton, MD on 05/11/21 for weight loss management. She had lost about 9 pounds over the previous 4 months on Qsymia and also on metformin (added to weight loss regimen and not for history of DM 12/22/20).  8-monthfollow-up planned. I called and spoke with patient. She reported last dose of Qsymia two days prior. Noted there are  recommendations not to stop phentermine-topiramate 15-92 mg abruptly, and instead taper every other day for at least a week if it needs to be held is recommended. Discussed with anesthesiologist STamela Gammon MD and JJenny Reichmannin [place] Plan for patient to take dose on 06/07/21 (she cannot take until after she arrives home ~ 5 PM). Since it can cause issues with sleeping if taken in the evening, will not plan to keep her on an evening dose before surgery. She will take 06/07/21 dose with anticipated next dose on 06/09/21 after surgery. This was discussed with Ms. Trujillo-Smith, as well as rationales. She denied any known seizure history. [place]   ? ?Anesthesia team to evaluate on the day of surgery.  ? ? ?VS: BP 136/79   Pulse 74   Temp (!) 36.4 ?C (Oral)   Resp 18   Ht '5\' 2"'$  (1.575 m)   Wt 82.7 kg   LMP 04/02/2007 (Approximate)   SpO2 98%   BMI 33.34 kg/m?  ? ? ?PROVIDERS: ?SLeone Haven MD is PCP  ? ? ?LABS: Labs reviewed: Acceptable for surgery. ?(all labs ordered are listed, but only abnormal results are  displayed) ? ?Labs Reviewed  ?BASIC METABOLIC PANEL - Abnormal; Notable for the following components:  ?    Result Value  ? Chloride 112 (*)   ? Creatinine, Ser 1.01 (*)   ? Anion gap 4 (*)   ? All other components within normal limits  ?CBC - Abnormal; Notable for the following components:  ? HCT 46.3 (*)   ? All other components within normal limits  ?SURGICAL PCR SCREEN  ? ? ?EKG: 06/06/21:  ?Normal sinus rhythm ?Left atrial enlargement ?Abnormal ECG ?No previous ECGs available ?Confirmed by CRudean Haskell([phone] on 06/06/2021 4:06:11 PM ? ? ?CV: N/A ? ?Past Medical History:  ?Diagnosis Date  ? Anxiety   ? Chicken pox   ? Headache   ? Hypertension   ? ? ?Past Surgical History:  ?Procedure Laterality Date  ? CERVICAL FUSION  12/03/2014  ? CESAREAN SECTION    ? 3  ? COLONOSCOPY WITH PROPOFOL N/A 04/02/2015  ? Procedure: COLONOSCOPY WITH PROPOFOL;  Surgeon: MLollie Sails MD;  [facility];  Service: Endoscopy;  Laterality: N/A;  ? TUBAL LIGATION    ? ? ?MEDICATIONS: ? albuterol (VENTOLIN HFA) 108 (90 Base) MCG/ACT inhaler  ? amLODipine (NORVASC) 5 MG tablet  ? benzonatate (TESSALON) 200 MG capsule  ? escitalopram (LEXAPRO) 10 MG tablet  ? gabapentin (NEURONTIN) 300 MG capsule  ?  Melatonin 10 MG TABS  ? meloxicam (MOBIC) 15 MG tablet  ? metFORMIN (GLUCOPHAGE-XR) 500 MG 24 hr tablet  ? Phentermine-Topiramate 15-92 MG CP24  ? predniSONE (DELTASONE) 20 MG tablet  ? valACYclovir (VALTREX) 1000 MG tablet  ? ?No current facility-administered medications for this encounter.  ? ?Per current medication list she is not taking prednisone, Tessalon, or Valtrex. ? ? ?Myra Gianotti, PA-C ?Surgical Short Stay/Anesthesiology ?[facility] Phone [phone] ?[facility] Phone [phone] ?06/07/2021 1:50 PM ? ? ? ? ?

## 2021-06-07 NOTE — Anesthesia Preprocedure Evaluation (Addendum)
Anesthesia Evaluation  ?Patient identified by MRN, date of birth, ID band ?Patient awake ? ? ? ?Reviewed: ?Allergy & Precautions, NPO status , Patient's Chart, lab work & pertinent test results ? ?History of Anesthesia Complications ?Negative for: history of anesthetic complications ? ?Airway ?Mallampati: I ? ?TM Distance: >3 FB ?Neck ROM: Full ? ? ? Dental ? ?(+) Dental Advisory Given, Teeth Intact ?  ?Pulmonary ?neg pulmonary ROS,  ?  ?breath sounds clear to auscultation ? ? ? ? ? ? Cardiovascular ?hypertension, Pt. on medications ?(-) angina ?Rhythm:Regular Rate:Normal ? ? ?  ?Neuro/Psych ? Headaches, Anxiety Depression   ? GI/Hepatic ?Neg liver ROS, GERD  Controlled,  ?Endo/Other  ?Obese: metformin, phenteramine ? Renal/GU ?negative Renal ROS  ? ?  ?Musculoskeletal ? ? Abdominal ?(+) + obese,   ?Peds ? Hematology ?negative hematology ROS ?(+)   ?Anesthesia Other Findings ? ? Reproductive/Obstetrics ? ?  ? ? ? ? ? ? ? ? ? ? ? ? ? ?  ?  ? ? ? ? ? ?Anesthesia Physical ?Anesthesia Plan ? ?ASA: 3 ? ?Anesthesia Plan: General  ? ?Post-op Pain Management: Tylenol PO (pre-op)*  ? ?Induction: Intravenous ? ?PONV Risk Score and Plan: 3 and Ondansetron, Dexamethasone and Scopolamine patch - Pre-op ? ?Airway Management Planned: Oral ETT and Video Laryngoscope Planned ? ?Additional Equipment: None ? ?Intra-op Plan:  ? ?Post-operative Plan: Extubation in OR ? ?Informed Consent: I have reviewed the patients History and Physical, chart, labs and discussed the procedure including the risks, benefits and alternatives for the proposed anesthesia with the patient or authorized representative who has indicated his/her understanding and acceptance.  ? ? ? ?Dental advisory given ? ?Plan Discussed with: CRNA and Surgeon ? ?Anesthesia Plan Comments: (PAT note written 06/07/2021 by Myra Gianotti, PA-C. ?)  ? ? ? ? ?Anesthesia Quick Evaluation ? ?

## 2021-06-08 DIAGNOSIS — M5412 Radiculopathy, cervical region: Secondary | ICD-10-CM | POA: Diagnosis not present

## 2021-06-09 ENCOUNTER — Ambulatory Visit (HOSPITAL_COMMUNITY): Payer: Federal, State, Local not specified - PPO | Admitting: Vascular Surgery

## 2021-06-09 ENCOUNTER — Other Ambulatory Visit: Payer: Self-pay

## 2021-06-09 ENCOUNTER — Ambulatory Visit (HOSPITAL_COMMUNITY): Payer: Federal, State, Local not specified - PPO

## 2021-06-09 ENCOUNTER — Encounter (HOSPITAL_COMMUNITY)
Admission: RE | Disposition: A | Discharge status: Home / Self Care | Payer: Self-pay | Source: Home / Self Care | Attending: Neurosurgery

## 2021-06-09 ENCOUNTER — Ambulatory Visit (HOSPITAL_COMMUNITY): Payer: Federal, State, Local not specified - PPO | Admitting: Anesthesiology

## 2021-06-09 ENCOUNTER — Observation Stay (HOSPITAL_COMMUNITY)
Admission: RE | Admit: 2021-06-09 | Discharge: 2021-06-10 | Disposition: A | Discharge status: Home / Self Care | Payer: Federal, State, Local not specified - PPO | Attending: Neurosurgery | Admitting: Neurosurgery

## 2021-06-09 ENCOUNTER — Encounter (HOSPITAL_COMMUNITY): Payer: Self-pay | Admitting: Neurosurgery

## 2021-06-09 DIAGNOSIS — I10 Essential (primary) hypertension: Secondary | ICD-10-CM | POA: Insufficient documentation

## 2021-06-09 DIAGNOSIS — Z79899 Other long term (current) drug therapy: Secondary | ICD-10-CM | POA: Insufficient documentation

## 2021-06-09 DIAGNOSIS — M4723 Other spondylosis with radiculopathy, cervicothoracic region: Secondary | ICD-10-CM | POA: Insufficient documentation

## 2021-06-09 DIAGNOSIS — M4803 Spinal stenosis, cervicothoracic region: Secondary | ICD-10-CM | POA: Diagnosis not present

## 2021-06-09 DIAGNOSIS — M4712 Other spondylosis with myelopathy, cervical region: Secondary | ICD-10-CM | POA: Diagnosis present

## 2021-06-09 DIAGNOSIS — M5013 Cervical disc disorder with radiculopathy, cervicothoracic region: Secondary | ICD-10-CM | POA: Diagnosis not present

## 2021-06-09 DIAGNOSIS — Z7984 Long term (current) use of oral hypoglycemic drugs: Secondary | ICD-10-CM | POA: Diagnosis not present

## 2021-06-09 DIAGNOSIS — M4713 Other spondylosis with myelopathy, cervicothoracic region: Secondary | ICD-10-CM | POA: Diagnosis not present

## 2021-06-09 DIAGNOSIS — M5003 Cervical disc disorder with myelopathy, cervicothoracic region: Principal | ICD-10-CM | POA: Insufficient documentation

## 2021-06-09 DIAGNOSIS — Z981 Arthrodesis status: Secondary | ICD-10-CM | POA: Diagnosis not present

## 2021-06-09 HISTORY — PX: ANTERIOR CERVICAL DECOMP/DISCECTOMY FUSION: SHX1161

## 2021-06-09 SURGERY — ANTERIOR CERVICAL DECOMPRESSION/DISCECTOMY FUSION 1 LEVEL
Anesthesia: General | Site: Spine Cervical

## 2021-06-09 MED ORDER — THROMBIN 5000 UNITS EX SOLR
CUTANEOUS | Status: DC | PRN
  Administered 2021-06-09 (×2): 5000 [IU] via TOPICAL

## 2021-06-09 MED ORDER — MELOXICAM 7.5 MG PO TABS
15.0000 mg | ORAL_TABLET | Freq: Every day | ORAL | Status: DC
  Administered 2021-06-09: 15 mg via ORAL
  Filled 2021-06-09: qty 2

## 2021-06-09 MED ORDER — CYCLOBENZAPRINE HCL 10 MG PO TABS
10.0000 mg | ORAL_TABLET | Freq: Three times a day (TID) | ORAL | Status: DC | PRN
  Administered 2021-06-09 – 2021-06-10 (×2): 10 mg via ORAL
  Filled 2021-06-09 (×3): qty 1

## 2021-06-09 MED ORDER — MELATONIN 5 MG PO TABS
10.0000 mg | ORAL_TABLET | Freq: Every day | ORAL | Status: DC
  Administered 2021-06-09: 10 mg via ORAL
  Filled 2021-06-09: qty 2

## 2021-06-09 MED ORDER — ALBUTEROL SULFATE HFA 108 (90 BASE) MCG/ACT IN AERS: 2.0000 | INHALATION_SPRAY | Freq: Four times a day (QID) | RESPIRATORY_TRACT | Status: DC | PRN

## 2021-06-09 MED ORDER — ESCITALOPRAM OXALATE 10 MG PO TABS
10.0000 mg | ORAL_TABLET | Freq: Every day | ORAL | Status: DC
  Administered 2021-06-09: 10 mg via ORAL
  Filled 2021-06-09: qty 1

## 2021-06-09 MED ORDER — PROMETHAZINE HCL 25 MG/ML IJ SOLN: 6.2500 mg | INTRAMUSCULAR | Status: DC | PRN

## 2021-06-09 MED ORDER — SUGAMMADEX SODIUM 200 MG/2ML IV SOLN
INTRAVENOUS | Status: DC | PRN
  Administered 2021-06-09 (×2): 200 mg via INTRAVENOUS

## 2021-06-09 MED ORDER — MENTHOL 3 MG MT LOZG
1.0000 | LOZENGE | OROMUCOSAL | Status: DC | PRN
  Administered 2021-06-09: 3 mg via ORAL
  Filled 2021-06-09 (×2): qty 9

## 2021-06-09 MED ORDER — CHLORHEXIDINE GLUCONATE CLOTH 2 % EX PADS: 6.0000 | MEDICATED_PAD | Freq: Once | CUTANEOUS | Status: DC

## 2021-06-09 MED ORDER — ONDANSETRON HCL 4 MG/2ML IJ SOLN
INTRAMUSCULAR | Status: AC
  Filled 2021-06-09: qty 2

## 2021-06-09 MED ORDER — ONDANSETRON HCL 4 MG PO TABS
4.0000 mg | ORAL_TABLET | Freq: Four times a day (QID) | ORAL | Status: DC | PRN
  Filled 2021-06-09: qty 1

## 2021-06-09 MED ORDER — SODIUM CHLORIDE 0.9% FLUSH
3.0000 mL | Freq: Two times a day (BID) | INTRAVENOUS | Status: DC
  Administered 2021-06-09: 3 mL via INTRAVENOUS

## 2021-06-09 MED ORDER — PHENYLEPHRINE HCL-NACL 20-0.9 MG/250ML-% IV SOLN
INTRAVENOUS | Status: DC | PRN
  Administered 2021-06-09: 25 ug/min via INTRAVENOUS

## 2021-06-09 MED ORDER — ONDANSETRON HCL 4 MG/2ML IJ SOLN
INTRAMUSCULAR | Status: DC | PRN
  Administered 2021-06-09: 4 mg via INTRAVENOUS

## 2021-06-09 MED ORDER — PHENTERMINE-TOPIRAMATE ER 15-92 MG PO CP24: 1.0000 | ORAL_CAPSULE | Freq: Every day | ORAL | Status: DC

## 2021-06-09 MED ORDER — ALBUTEROL SULFATE (2.5 MG/3ML) 0.083% IN NEBU: 2.5000 mg | INHALATION_SOLUTION | Freq: Four times a day (QID) | RESPIRATORY_TRACT | Status: DC | PRN

## 2021-06-09 MED ORDER — ONDANSETRON HCL 4 MG/2ML IJ SOLN
4.0000 mg | Freq: Four times a day (QID) | INTRAMUSCULAR | Status: DC | PRN
  Filled 2021-06-09: qty 2

## 2021-06-09 MED ORDER — ACETAMINOPHEN 325 MG PO TABS
650.0000 mg | ORAL_TABLET | ORAL | Status: DC | PRN
Start: 2021-06-09 — End: 2021-06-10
  Filled 2021-06-09: qty 2

## 2021-06-09 MED ORDER — VASOPRESSIN 20 UNIT/ML IV SOLN
INTRAVENOUS | Status: AC
  Filled 2021-06-09: qty 1

## 2021-06-09 MED ORDER — OXYCODONE HCL 5 MG/5ML PO SOLN: 5.0000 mg | Freq: Once | ORAL | Status: DC | PRN

## 2021-06-09 MED ORDER — FENTANYL CITRATE (PF) 250 MCG/5ML IJ SOLN
INTRAMUSCULAR | Status: DC | PRN
  Administered 2021-06-09 (×5): 50 ug via INTRAVENOUS

## 2021-06-09 MED ORDER — BENZONATATE 100 MG PO CAPS: 200.0000 mg | ORAL_CAPSULE | Freq: Two times a day (BID) | ORAL | Status: DC | PRN

## 2021-06-09 MED ORDER — MIDAZOLAM HCL 2 MG/2ML IJ SOLN
INTRAMUSCULAR | Status: DC | PRN
  Administered 2021-06-09: 2 mg via INTRAVENOUS

## 2021-06-09 MED ORDER — CHLORHEXIDINE GLUCONATE 0.12 % MT SOLN: 15.0000 mL | Freq: Once | OROMUCOSAL | Status: AC

## 2021-06-09 MED ORDER — OXYCODONE HCL 5 MG PO TABS: 5.0000 mg | ORAL_TABLET | Freq: Once | ORAL | Status: DC | PRN

## 2021-06-09 MED ORDER — CEFAZOLIN SODIUM-DEXTROSE 1-4 GM/50ML-% IV SOLN
1.0000 g | Freq: Three times a day (TID) | INTRAVENOUS | Status: DC
  Administered 2021-06-09 – 2021-06-10 (×3): 1 g via INTRAVENOUS
  Filled 2021-06-09 (×3): qty 50

## 2021-06-09 MED ORDER — HYDROMORPHONE HCL 1 MG/ML IJ SOLN
1.0000 mg | INTRAMUSCULAR | Status: DC | PRN
  Filled 2021-06-09: qty 1

## 2021-06-09 MED ORDER — SODIUM CHLORIDE 0.9 % IV SOLN
250.0000 mL | INTRAVENOUS | Status: DC
  Administered 2021-06-09: 250 mL via INTRAVENOUS

## 2021-06-09 MED ORDER — VANCOMYCIN HCL IN DEXTROSE 1-5 GM/200ML-% IV SOLN
INTRAVENOUS | Status: AC
  Administered 2021-06-09: 1000 mg via INTRAVENOUS
  Filled 2021-06-09: qty 200

## 2021-06-09 MED ORDER — SODIUM CHLORIDE 0.9% FLUSH: 3.0000 mL | INTRAVENOUS | Status: DC | PRN

## 2021-06-09 MED ORDER — METFORMIN HCL ER 500 MG PO TB24
500.0000 mg | ORAL_TABLET | Freq: Every evening | ORAL | Status: DC
  Administered 2021-06-09: 500 mg via ORAL
  Filled 2021-06-09: qty 1

## 2021-06-09 MED ORDER — HEMOSTATIC AGENTS (NO CHARGE) OPTIME
TOPICAL | Status: DC | PRN
  Administered 2021-06-09: 1 via TOPICAL

## 2021-06-09 MED ORDER — ROCURONIUM BROMIDE 10 MG/ML (PF) SYRINGE
PREFILLED_SYRINGE | INTRAVENOUS | Status: AC
  Filled 2021-06-09: qty 10

## 2021-06-09 MED ORDER — FENTANYL CITRATE (PF) 250 MCG/5ML IJ SOLN
INTRAMUSCULAR | Status: AC
  Filled 2021-06-09: qty 5

## 2021-06-09 MED ORDER — HYDROMORPHONE HCL 1 MG/ML IJ SOLN
INTRAMUSCULAR | Status: AC
  Filled 2021-06-09: qty 1

## 2021-06-09 MED ORDER — EPHEDRINE SULFATE-NACL 50-0.9 MG/10ML-% IV SOSY
PREFILLED_SYRINGE | INTRAVENOUS | Status: DC | PRN
  Administered 2021-06-09 (×2): 5 mg via INTRAVENOUS

## 2021-06-09 MED ORDER — PROPOFOL 10 MG/ML IV BOLUS
INTRAVENOUS | Status: DC | PRN
  Administered 2021-06-09: 100 mg via INTRAVENOUS

## 2021-06-09 MED ORDER — DIPHENHYDRAMINE HCL 50 MG/ML IJ SOLN
INTRAMUSCULAR | Status: DC | PRN
  Administered 2021-06-09: 25 mg via INTRAVENOUS

## 2021-06-09 MED ORDER — ACETAMINOPHEN 500 MG PO TABS
ORAL_TABLET | ORAL | Status: AC
  Administered 2021-06-09: 1000 mg via ORAL
  Filled 2021-06-09: qty 2

## 2021-06-09 MED ORDER — ACETAMINOPHEN 650 MG RE SUPP
650.0000 mg | RECTAL | Status: DC | PRN
  Filled 2021-06-09: qty 1

## 2021-06-09 MED ORDER — AMLODIPINE BESYLATE 5 MG PO TABS: 5.0000 mg | ORAL_TABLET | Freq: Every day | ORAL | Status: DC

## 2021-06-09 MED ORDER — HYDROCODONE-ACETAMINOPHEN 10-325 MG PO TABS
1.0000 | ORAL_TABLET | ORAL | Status: DC | PRN
  Administered 2021-06-09 – 2021-06-10 (×3): 1 via ORAL
  Filled 2021-06-09: qty 2
  Filled 2021-06-09 (×3): qty 1

## 2021-06-09 MED ORDER — ORAL CARE MOUTH RINSE
15.0000 mL | Freq: Once | OROMUCOSAL | Status: AC
Start: 2021-06-09 — End: 2021-06-09

## 2021-06-09 MED ORDER — DEXAMETHASONE SODIUM PHOSPHATE 10 MG/ML IJ SOLN
INTRAMUSCULAR | Status: DC | PRN
  Administered 2021-06-09: 10 mg via INTRAVENOUS

## 2021-06-09 MED ORDER — DIPHENHYDRAMINE HCL 50 MG/ML IJ SOLN
INTRAMUSCULAR | Status: AC
  Filled 2021-06-09: qty 1

## 2021-06-09 MED ORDER — PROPOFOL 10 MG/ML IV BOLUS
INTRAVENOUS | Status: AC
  Filled 2021-06-09: qty 20

## 2021-06-09 MED ORDER — LIDOCAINE 2% (20 MG/ML) 5 ML SYRINGE
INTRAMUSCULAR | Status: AC
  Filled 2021-06-09: qty 5

## 2021-06-09 MED ORDER — CHLORHEXIDINE GLUCONATE 0.12 % MT SOLN
OROMUCOSAL | Status: AC
  Administered 2021-06-09: 15 mL via OROMUCOSAL
  Filled 2021-06-09: qty 15

## 2021-06-09 MED ORDER — THROMBIN 5000 UNITS EX SOLR
CUTANEOUS | Status: AC
  Filled 2021-06-09: qty 5000

## 2021-06-09 MED ORDER — DEXAMETHASONE SODIUM PHOSPHATE 10 MG/ML IJ SOLN
INTRAMUSCULAR | Status: AC
  Filled 2021-06-09: qty 1

## 2021-06-09 MED ORDER — SCOPOLAMINE 1 MG/3DAYS TD PT72: 1.0000 | MEDICATED_PATCH | TRANSDERMAL | Status: DC

## 2021-06-09 MED ORDER — ROCURONIUM BROMIDE 10 MG/ML (PF) SYRINGE
PREFILLED_SYRINGE | INTRAVENOUS | Status: DC | PRN
  Administered 2021-06-09: 60 mg via INTRAVENOUS
  Administered 2021-06-09: 20 mg via INTRAVENOUS

## 2021-06-09 MED ORDER — MIDAZOLAM HCL 2 MG/2ML IJ SOLN: 0.5000 mg | Freq: Once | INTRAMUSCULAR | Status: DC | PRN

## 2021-06-09 MED ORDER — HYDROMORPHONE HCL 1 MG/ML IJ SOLN
0.2500 mg | INTRAMUSCULAR | Status: DC | PRN
  Administered 2021-06-09: 0.5 mg via INTRAVENOUS

## 2021-06-09 MED ORDER — ACETAMINOPHEN 500 MG PO TABS: 1000.0000 mg | ORAL_TABLET | Freq: Once | ORAL | Status: AC

## 2021-06-09 MED ORDER — HYDROCODONE-ACETAMINOPHEN 5-325 MG PO TABS
1.0000 | ORAL_TABLET | ORAL | Status: DC | PRN
  Filled 2021-06-09: qty 1

## 2021-06-09 MED ORDER — 0.9 % SODIUM CHLORIDE (POUR BTL) OPTIME
TOPICAL | Status: DC | PRN
  Administered 2021-06-09: 1000 mL

## 2021-06-09 MED ORDER — VANCOMYCIN HCL IN DEXTROSE 1-5 GM/200ML-% IV SOLN: 1000.0000 mg | INTRAVENOUS | Status: AC

## 2021-06-09 MED ORDER — MEPERIDINE HCL 25 MG/ML IJ SOLN: 6.2500 mg | INTRAMUSCULAR | Status: DC | PRN

## 2021-06-09 MED ORDER — SCOPOLAMINE 1 MG/3DAYS TD PT72
MEDICATED_PATCH | TRANSDERMAL | Status: AC
  Administered 2021-06-09: 1.5 mg via TRANSDERMAL
  Filled 2021-06-09: qty 1

## 2021-06-09 MED ORDER — LACTATED RINGERS IV SOLN: INTRAVENOUS | Status: DC

## 2021-06-09 MED ORDER — LIDOCAINE 2% (20 MG/ML) 5 ML SYRINGE
INTRAMUSCULAR | Status: DC | PRN
  Administered 2021-06-09: 40 mg via INTRAVENOUS

## 2021-06-09 MED ORDER — PHENOL 1.4 % MT LIQD
1.0000 | OROMUCOSAL | Status: DC | PRN
  Filled 2021-06-09: qty 177

## 2021-06-09 MED ORDER — GABAPENTIN 300 MG PO CAPS
300.0000 mg | ORAL_CAPSULE | Freq: Every day | ORAL | Status: DC
  Administered 2021-06-09: 300 mg via ORAL
  Filled 2021-06-09: qty 1

## 2021-06-09 MED ORDER — MIDAZOLAM HCL 2 MG/2ML IJ SOLN
INTRAMUSCULAR | Status: AC
  Filled 2021-06-09: qty 2

## 2021-06-09 SURGICAL SUPPLY — 54 items
BAG COUNTER SPONGE SURGICOUNT (BAG) ×2 IMPLANT
BAG DECANTER FOR FLEXI CONT (MISCELLANEOUS) ×2 IMPLANT
BAND RUBBER #18 3X1/16 STRL (MISCELLANEOUS) ×4 IMPLANT
BENZOIN TINCTURE PRP APPL 2/3 (GAUZE/BANDAGES/DRESSINGS) ×2 IMPLANT
BUR MATCHSTICK NEURO 3.0 LAGG (BURR) ×2 IMPLANT
CAGE PEEK 9X14X11 (Cage) ×1 IMPLANT
CANISTER SUCT 3000ML PPV (MISCELLANEOUS) ×2 IMPLANT
CARTRIDGE OIL MAESTRO DRILL (MISCELLANEOUS) ×1 IMPLANT
CLSR STERI-STRIP ANTIMIC 1/2X4 (GAUZE/BANDAGES/DRESSINGS) ×1 IMPLANT
DIFFUSER DRILL AIR PNEUMATIC (MISCELLANEOUS) ×2 IMPLANT
DRAPE C-ARM 42X72 X-RAY (DRAPES) ×4 IMPLANT
DRAPE LAPAROTOMY 100X72 PEDS (DRAPES) ×2 IMPLANT
DRAPE MICROSCOPE LEICA (MISCELLANEOUS) ×2 IMPLANT
DRSG OPSITE POSTOP 4X6 (GAUZE/BANDAGES/DRESSINGS) ×1 IMPLANT
DURAPREP 6ML APPLICATOR 50/CS (WOUND CARE) ×2 IMPLANT
ELECT COATED BLADE 2.86 ST (ELECTRODE) ×2 IMPLANT
ELECT REM PT RETURN 9FT ADLT (ELECTROSURGICAL) ×2
ELECTRODE REM PT RTRN 9FT ADLT (ELECTROSURGICAL) ×1 IMPLANT
EVACUATOR 1/8 PVC DRAIN (DRAIN) ×1 IMPLANT
GAUZE 4X4 16PLY ~~LOC~~+RFID DBL (SPONGE) IMPLANT
GAUZE SPONGE 4X4 12PLY STRL (GAUZE/BANDAGES/DRESSINGS) ×2 IMPLANT
GLOVE EXAM NITRILE XL STR (GLOVE) IMPLANT
GLOVE SURG LTX SZ9 (GLOVE) ×2 IMPLANT
GLOVE SURG UNDER POLY LF SZ6.5 (GLOVE) ×3 IMPLANT
GLOVE SURG UNDER POLY LF SZ7 (GLOVE) ×3 IMPLANT
GOWN STRL REUS W/ TWL LRG LVL3 (GOWN DISPOSABLE) IMPLANT
GOWN STRL REUS W/ TWL XL LVL3 (GOWN DISPOSABLE) IMPLANT
GOWN STRL REUS W/TWL 2XL LVL3 (GOWN DISPOSABLE) IMPLANT
GOWN STRL REUS W/TWL LRG LVL3 (GOWN DISPOSABLE) ×2
GOWN STRL REUS W/TWL XL LVL3 (GOWN DISPOSABLE) ×1
HALTER HD/CHIN CERV TRACTION D (MISCELLANEOUS) ×2 IMPLANT
HEMOSTAT POWDER KIT SURGIFOAM (HEMOSTASIS) IMPLANT
KIT BASIN OR (CUSTOM PROCEDURE TRAY) ×2 IMPLANT
KIT TURNOVER KIT B (KITS) ×2 IMPLANT
NDL SPNL 20GX3.5 QUINCKE YW (NEEDLE) ×1 IMPLANT
NEEDLE SPNL 20GX3.5 QUINCKE YW (NEEDLE) ×2 IMPLANT
NS IRRIG 1000ML POUR BTL (IV SOLUTION) ×2 IMPLANT
OIL CARTRIDGE MAESTRO DRILL (MISCELLANEOUS) ×2
PACK LAMINECTOMY NEURO (CUSTOM PROCEDURE TRAY) ×2 IMPLANT
PAD ARMBOARD 7.5X6 YLW CONV (MISCELLANEOUS) ×7 IMPLANT
PIN DISTRACTION 14MM (PIN) ×2 IMPLANT
PLATE ELITE 30MM (Plate) ×1 IMPLANT
SCREW ST 13X4XST VA NS SPNE (Screw) IMPLANT
SCREW ST VAR 4 ATL (Screw) ×4 IMPLANT
SPONGE INTESTINAL PEANUT (DISPOSABLE) ×2 IMPLANT
SPONGE SURGIFOAM ABS GEL SZ50 (HEMOSTASIS) ×2 IMPLANT
STRIP CLOSURE SKIN 1/2X4 (GAUZE/BANDAGES/DRESSINGS) ×2 IMPLANT
SUT VIC AB 3-0 SH 8-18 (SUTURE) ×2 IMPLANT
SUT VIC AB 4-0 RB1 18 (SUTURE) ×2 IMPLANT
TAPE CLOTH 4X10 WHT NS (GAUZE/BANDAGES/DRESSINGS) ×2 IMPLANT
TOWEL GREEN STERILE (TOWEL DISPOSABLE) ×2 IMPLANT
TOWEL GREEN STERILE FF (TOWEL DISPOSABLE) ×2 IMPLANT
TRAP SPECIMEN MUCUS 40CC (MISCELLANEOUS) ×2 IMPLANT
WATER STERILE IRR 1000ML POUR (IV SOLUTION) ×2 IMPLANT

## 2021-06-09 NOTE — Op Note (Signed)
Date of procedure: 06/09/2021 ? ?Date of dictation: Same ? ?Service: Neurosurgery ? ?Preoperative diagnosis: Left C7-T1 hurting nucleus pulposus with myelopathy and radiculopathy ? ?Postoperative diagnosis: Same ? ?Procedure Name: C7-T1 anterior cervical discectomy with interbody fusion utilizing interbody cage, will curbside autograft, and anterior plate instrumentation ? ?Removal of C4-C7 anterior cervical plate ? ?Surgeon:Kirstein Baxley A.Kwynn Schlotter, M.D. ? ?Asst. Surgeon: Reinaldo Meeker, NP ? ?Anesthesia: General ? ?Indication: 61 year old female status post prior C4-C7 decompression and fusion with good results presents now with worsening neck and interscapular pain with radiating pain in both upper extremities.  Work-up demonstrates evidence of paracentral disc herniation at C7-T1 with cord compression and exiting left C8 nerve root compression.  Patient presents now for C7-T1 anterior cervical discectomy and fusion in hopes of improving her symptoms. ? ?Operative note: After induction of anesthesia, patient positioned supine with neck slightly extended and held placed halter traction.  Patient's anterior cervical region prepped and draped sterilely.  Incision made over C6.  Dissection performed on the right.  Previously placed C4-C7 plate was dissected free, disassembled and removed.  Fusions inspected at C4-5, C5-6 and C6-7 and all found to be solid.  Dissection performed at C7 and T1.  Retractor placed.  Dissipates in size.  Discectomy then performed using various instruments down to the level of posterior annulus.  Microscope then brought to field used throughout the remainder of the discectomy.  Remaining aspects of annulus and osteophytes removed using high-speed drill down to level posterior logical limb.  Posterior logical is not elevated and resected in piecemeal fashion.  Underlying thecal sac was then identified.  Wide central decompression then performed undercutting the bodies of C7 and T1.  Decompression then proceeded  each neural foramina.  Wide anterior foraminotomies performed on the course exiting C8 nerve roots bilaterally.  At this point a very thorough decompression of been achieved.  There is no evidence of injury to thecal sac or nerve roots.  Wound was then irrigated.  Gelfoam was placed topically for hemostasis then removed.  9 mm [employer]onic anatomic peek cage was packed with locally harvested autograft.  This then impacted in the place.  Atlantis anterior cervical plate was then placed through the C7 and T1 level.  This then attached under fluoroscopic guidance using 13 mm variable angle screws to each of both levels.  All screws given final tightening found to be solidly within the bone.  Locking screws engaged both levels.  Final images reveal good position of the cage and the hardware at the proper operative level with normal alignment of spine.  Wounds irrigated 1 final time.  Hemostasis was assured.  A medium Hemovac drain was left in the prevertebral space.  Wounds and closed in layers with Vicryl sutures.  Steri-Strips and sterile dressing were applied.  No apparent complications.  Patient tolerated the procedure well and she returns to the recovery room postop. ? ?

## 2021-06-09 NOTE — Anesthesia Postprocedure Evaluation (Signed)
Anesthesia Post Note ? ?Patient: Tammy Gregory ? ?Procedure(s) Performed: Anterior Cervical Discectomy Fusion - Cervical seven-Thoracic one (Spine Cervical) ? ?  ? ?Patient location during evaluation: PACU ?Anesthesia Type: General ?Level of consciousness: awake and alert, patient cooperative and oriented ?Pain management: pain level controlled ?Vital Signs Assessment: post-procedure vital signs reviewed and stable ?Respiratory status: spontaneous breathing, nonlabored ventilation and respiratory function stable ?Cardiovascular status: blood pressure returned to baseline and stable ?Postop Assessment: no apparent nausea or vomiting ?Anesthetic complications: no ? ? ?No notable events documented. ? ?Last Vitals:  ?Vitals:  ? 06/09/21 1055 06/09/21 1123  ?BP: (!) 142/80 138/78  ?Pulse: 72 70  ?Resp: 15 18  ?Temp: 36.4 ?C   ?SpO2: 98% 100%  ?  ?Last Pain:  ?Vitals:  ? 06/09/21 1055  ?TempSrc:   ?PainSc: 3   ? ? ?  ?  ?  ?  ?  ?  ? ?Tammy Gregory,E. Tymia Streb ? ? ? ? ?

## 2021-06-09 NOTE — Evaluation (Signed)
Occupational Therapy Evaluation ?Patient Details ?Name: Tammy Gregory ?MRN: [id number] ?DOB: [birth year] ?Today's Date: 06/09/2021 ? ? ?History of Present Illness 61 y.o. F admitted on 06/09/21 for C4-C7 ACDF. PMH significant for HTN and anxiety.  ? ?Clinical Impression ?  ?Pt admitted for concerns listed above. PTA pt reported independence with all ADL's and IADL's, using no DME. At this time, pt is able to complete BADL's with no assist, following compensatory strategies. Overall, pt is able to mobilize with no difficulties. She has no further OT concerns at this time and acute OT will sign off.   ?   ? ?Recommendations for follow up therapy are one component of a multi-disciplinary discharge planning process, led by the attending physician.  Recommendations may be updated based on patient status, additional functional criteria and insurance authorization.  ? ?Follow Up Recommendations ? No OT follow up  ?  ?Assistance Recommended at Discharge PRN  ?Patient can return home with the following A little help with bathing/dressing/bathroom;Assistance with cooking/housework ? ?  ?Functional Status Assessment ? Patient has had a recent decline in their functional status and demonstrates the ability to make significant improvements in function in a reasonable and predictable amount of time.  ?Equipment Recommendations ? None recommended by OT  ?  ?Recommendations for Other Services   ? ? ?  ?Precautions / Restrictions Precautions ?Precautions: Cervical ?Precaution Booklet Issued: Yes (comment) ?Precaution Comments: Reviewed all precautions and compensatory strategies ?Required Braces or Orthoses: Cervical Brace ?Cervical Brace: Soft collar;At all times ?Restrictions ?Weight Bearing Restrictions: No  ? ?  ? ?Mobility Bed Mobility ?Overal bed mobility: Modified Independent ?  ?  ?  ?  ?  ?  ?General bed mobility comments: Following log roll technique ?  ? ?Transfers ?Overall transfer level: Modified  independent ?Equipment used: None ?  ?  ?  ?  ?  ?  ?  ?  ?  ? ?  ?Balance Overall balance assessment: No apparent balance deficits (not formally assessed) ?  ?  ?  ?  ?  ?  ?  ?  ?  ?  ?  ?  ?  ?  ?  ?  ?  ?  ?   ? ?ADL either performed or assessed with clinical judgement  ? ?ADL Overall ADL's : Modified independent ?  ?  ?  ?  ?  ?  ?  ?  ?  ?  ?  ?  ?  ?  ?  ?  ?  ?  ?  ?General ADL Comments: Pt able to complete BADL's and functional mobility with mod I using compensatory strategies  ? ? ? ?Vision Baseline Vision/History: 1 Wears glasses ?Ability to See in Adequate Light: 0 Adequate ?Patient Visual Report: No change from baseline ?Vision Assessment?: No apparent visual deficits  ?   ?Perception   ?  ?Praxis   ?  ? ?Pertinent Vitals/Pain Pain Assessment ?Pain Assessment: Faces ?Faces Pain Scale: Hurts little more ?Pain Location: neck ?Pain Descriptors / Indicators: Aching, Grimacing, Discomfort ?Pain Intervention(s): Monitored during session, Repositioned  ? ? ? ?Hand Dominance Right ?  ?Extremity/Trunk Assessment Upper Extremity Assessment ?Upper Extremity Assessment: Overall WFL for tasks assessed ?  ?Lower Extremity Assessment ?Lower Extremity Assessment: Overall WFL for tasks assessed ?  ?Cervical / Trunk Assessment ?Cervical / Trunk Assessment: Normal ?  ?Communication Communication ?Communication: No difficulties ?  ?Cognition Arousal/Alertness: Awake/alert ?Behavior During Therapy: [facility] for tasks assessed/performed ?Overall Cognitive Status: Within Functional  Limits for tasks assessed ?  ?  ?  ?  ?  ?  ?  ?  ?  ?  ?  ?  ?  ?  ?  ?  ?  ?  ?  ?General Comments  VSS on RA, cervicalcollar intact ? ?  ?Exercises   ?  ?Shoulder Instructions    ? ? ?Home Living Family/patient expects to be discharged to:: Private residence ?Living Arrangements: Spouse/significant other ?Available Help at Discharge: Family ?Type of Home: House ?Home Access: Level entry ?  ?  ?  ?  ?  ?Bathroom Shower/Tub: Walk-in shower ?  ?Bathroom  Toilet: Standard ?  ?  ?Home Equipment: None ?  ?  ?  ? ?  ?Prior Functioning/Environment Prior Level of Function : Independent/Modified Independent;Driving ?  ?  ?  ?  ?  ?  ?  ?  ?  ? ?  ?  ?OT Problem List: Decreased strength;Decreased activity tolerance;Impaired balance (sitting and/or standing);Pain ?  ?   ?OT Treatment/Interventions:    ?  ?OT Goals(Current goals can be found in the care plan section) Acute Rehab OT Goals ?Patient Stated Goal: To go home ?OT Goal Formulation: With patient ?Time For Goal Achievement: 06/09/21 ?Potential to Achieve Goals: Good  ?OT Frequency:   ?  ? ?Co-evaluation   ?  ?  ?  ?  ? ?  ?AM-PAC OT "6 Clicks" Daily Activity     ?Outcome Measure Help from another person eating meals?: None ?Help from another person taking care of personal grooming?: None ?Help from another person toileting, which includes using toliet, bedpan, or urinal?: None ?Help from another person bathing (including washing, rinsing, drying)?: None ?Help from another person to put on and taking off regular upper body clothing?: None ?Help from another person to put on and taking off regular lower body clothing?: None ?6 Click Score: 24 ?  ?End of Session Equipment Utilized During Treatment: Cervical collar ?Nurse Communication: Mobility status ? ?Activity Tolerance: Patient tolerated treatment well ?Patient left: in bed;with call bell/phone within reach;with family/visitor present ? ?OT Visit Diagnosis: Unsteadiness on feet (R26.81);Other abnormalities of gait and mobility (R26.89);Muscle weakness (generalized) (M62.81)  ?              ?Time: 7673-4193 ?OT Time Calculation (min): 25 min ?Charges:  OT General Charges ?$OT Visit: 1 Visit ?OT Evaluation ?$OT Eval Moderate Complexity: 1 Mod ?OT Treatments ?$Self Care/Home Management : 8-22 mins ? ?Bellina Tokarczyk H., OTR/L ?Acute Rehabilitation ? ?Tevis Conger Elane Yolanda Bonine ?06/09/2021, 8:21 PM ?

## 2021-06-09 NOTE — Progress Notes (Signed)
Postop check.  Overall doing very well.  Pain much improved from preop.  No numbness paresthesias or weakness.  Swallowing well.  Voice strong.  Wound clean and dry. ? ?Progressing well following anterior cervical decompression and fusion surgery.  Mobilize tonight.  Should be able to discharge in morning. ?

## 2021-06-09 NOTE — H&P (Signed)
Tammy Gregory is an 61 y.o. female.   ?Chief Complaint: Neck pain ?HPI: 61 year old female status post prior C4-5, C5-6, C6-7 anterior cervical discectomy and fusion presents now with worsening neck pain, interscapular pain and radiating numbness into her upper extremities left greater than right.  Work-up demonstrates evidence of a moderately large left paracentral disc herniation at C7-T1 with spinal cord compression and exiting left C8 nerve root compression.  Patient has failed conservative management presents now for C7-T1 anterior cervical discectomy and fusion in hopes of improving her symptoms. ? ?Past Medical History:  ?Diagnosis Date  ? Anxiety   ? Chicken pox   ? Headache   ? Hypertension   ? ? ?Past Surgical History:  ?Procedure Laterality Date  ? CERVICAL FUSION  12/03/2014  ? CESAREAN SECTION    ? 3  ? COLONOSCOPY WITH PROPOFOL N/A 04/02/2015  ? Procedure: COLONOSCOPY WITH PROPOFOL;  Surgeon: Lollie Sails, MD;  Location[facility] ENDOSCOPY;  Service: Endoscopy;  Laterality: N/A;  ? TUBAL LIGATION    ? ? ?Family History  ?Problem Relation Age of Onset  ? Hypertension Father   ? Hypertension Mother   ? Breast cancer Cousin   ?     2 mat cousins  ? ?Social History:  reports that she has never smoked. She has never used smokeless tobacco. She reports current alcohol use. She reports that she does not use drugs. ? ?Allergies:  ?Allergies  ?Allergen Reactions  ? Codeine Nausea And Vomiting  ?  Other Reaction: DIZZINESS.  ? Amoxicillin Diarrhea  ? Clindamycin Diarrhea  ? ? ?Medications Prior to Admission  ?Medication Sig Dispense Refill  ? amLODipine (NORVASC) 5 MG tablet TAKE 1 TABLET BY MOUTH EVERY DAY 90 tablet 3  ? escitalopram (LEXAPRO) 10 MG tablet TAKE 1 TABLET BY MOUTH EVERY DAY (Patient taking differently: Take 10 mg by mouth at bedtime.) 90 tablet 1  ? gabapentin (NEURONTIN) 300 MG capsule Take 1 capsule (300 mg total) by mouth at bedtime. 90 capsule 1  ? Melatonin 10 MG TABS Take 10 mg by  mouth at bedtime.    ? meloxicam (MOBIC) 15 MG tablet TAKE 1 TABLET BY MOUTH EVERY DAY AS NEEDED FOR PAIN (Patient taking differently: Take 15 mg by mouth at bedtime.) 30 tablet 2  ? metFORMIN (GLUCOPHAGE-XR) 500 MG 24 hr tablet Take 500 mg by mouth every evening.    ? Phentermine-Topiramate 15-92 MG CP24 Take 1 tablet by mouth daily.    ? albuterol (VENTOLIN HFA) 108 (90 Base) MCG/ACT inhaler TAKE 2 PUFFS BY MOUTH EVERY 6 HOURS AS NEEDED FOR WHEEZE OR SHORTNESS OF BREATH 8.5 each 0  ? benzonatate (TESSALON) 200 MG capsule Take 1 capsule (200 mg total) by mouth 2 (two) times daily as needed for cough. (Patient not taking: Reported on 06/01/2021) 20 capsule 0  ? predniSONE (DELTASONE) 20 MG tablet Take 2 tablets (40 mg total) by mouth daily with breakfast. (Patient not taking: Reported on 06/01/2021) 10 tablet 0  ? valACYclovir (VALTREX) 1000 MG tablet Take 1 tablet (1,000 mg total) by mouth 3 (three) times daily. (Patient not taking: Reported on 06/01/2021) 21 tablet 0  ? ? ?No results found for this or any previous visit (from the past 48 hour(s)). ?No results found. ? ?Pertinent items noted in HPI and remainder of comprehensive ROS otherwise negative. ? ?Blood pressure 122/66, pulse 69, temperature (!) 97.5 ?F (36.4 ?C), temperature source Oral, resp. rate 18, height '5\' 2"'$  (1.575 m), weight 81.6  kg, last menstrual period 04/02/2007, SpO2 96 %. ? ?Patient is awake and alert.  She is oriented and appropriate.  Speech is fluent.  Judgment insight are intact.  Cranial nerve function normal bilateral.  Motor examination reveals mild weakness of intrinsic function in her left hand otherwise motor strength intact.  Sensory examination decrease sensation pinprick touch in her left C8 dermatome.  Deep Temrex is normal active.  Noes long track signs.  Gait normal.  Posture normal peer examination head ears eyes nose throat some are clear chest and abdomen benign.  Extremities are free of major  deformity. ?Assessment/Plan ?C7-T1 hernia nucleus pulposus with myelopathy and radiculopathy.  Plan C7-T1 anterior cervical discectomy with interbody fusion interbody cages, local harvested autograft, and anterior plate is rotation.  Risks and benefits been explained.  Patient wishes to proceed. ? ?Keyston Ardolino A Jhonatan Lomeli ?06/09/2021, 7:46 AM ? ? ? ? ?

## 2021-06-09 NOTE — Progress Notes (Signed)
Orthopedic Tech Progress Note ?Patient Details:  ?Tammy Gregory ?[birth year] ?[id number] ?Soft collar applied in PACU ?Patient ID: [name], female   DOB: 12-01-1960, 61 y.o.   MRN: [id number] ? ?[name] ?06/09/2021, 11:27 AM ? ?

## 2021-06-09 NOTE — Progress Notes (Signed)
Pharmacy Antibiotic Note ? ?Tammy Gregory is a 61 y.o. female admitted on 06/09/2021 with surgical prophylaxis.  Pharmacy has been consulted for vancomycin if penicillin allergic. Per documentation, amoxicillin causes diarrhea, is not a true allergy but a side effect. Patient as not received penicillins in our sytem.  ? ?Per RN, patient has hemovac drain. Received vancomycin 1g x1 at 7am. ClCr 58 ml/min. ? ?Plan: ?Cefazolin 1 g q8 hr until drain removed  ?Monitor diarrhea ? ?Height: '5\' 2"'$  (157.5 cm) ?Weight: 81.6 kg (180 lb) ?IBW/kg (Calculated) : 50.1 ? ?Temp (24hrs), Avg:97.6 ?F (36.4 ?C), Min:97.5 ?F (36.4 ?C), Max:97.6 ?F (36.4 ?C) ? ?Recent Labs  ?Lab 06/06/21 ?0942  ?WBC 6.0  ?CREATININE 1.01*  ?  ?Estimated Creatinine Clearance: 58.6 mL/min (A) (by C-G formula based on SCr of 1.01 mg/dL (H)).   ? ?Allergies  ?Allergen Reactions  ? Codeine Nausea And Vomiting  ?  Other Reaction: DIZZINESS.  ? Amoxicillin Diarrhea  ? Clindamycin Diarrhea  ?  ?Thank you for allowing pharmacy to be a part of this patient?s care. ? ?Benetta Spar, PharmD, BCPS, BCCP ?Clinical Pharmacist ? ?Please check AMION for all [place] phone numbers ?After 10:00 PM, call [place] [phone] ? ?

## 2021-06-09 NOTE — Transfer of Care (Signed)
Immediate Anesthesia Transfer of Care Note ? ?Patient: Tammy Gregory ? ?Procedure(s) Performed: Anterior Cervical Discectomy Fusion - Cervical seven-Thoracic one (Spine Cervical) ? ?Patient Location: PACU ? ?Anesthesia Type:General ? ?Level of Consciousness: awake, alert  and oriented ? ?Airway & Oxygen Therapy: Patient Spontanous Breathing and Patient connected to nasal cannula oxygen ? ?Post-op Assessment: Report given to RN and Post -op Vital signs reviewed and stable ? ?Post vital signs: Reviewed and stable ? ?Last Vitals:  ?Vitals Value Taken Time  ?BP 137/77 06/09/21 1007  ?Temp    ?Pulse 71 06/09/21 1013  ?Resp 12 06/09/21 1013  ?SpO2 92 % 06/09/21 1013  ?Vitals shown include unvalidated device data. ? ?Last Pain:  ?Vitals:  ? 06/09/21 0706  ?TempSrc:   ?PainSc: 0-No pain  ?   ? ?  ? ?Complications: No notable events documented. ?

## 2021-06-09 NOTE — Anesthesia Procedure Notes (Signed)
Procedure Name: Intubation ?Date/Time: 06/09/2021 8:17 AM ?Performed by: Valda Favia, CRNA ?Pre-anesthesia Checklist: Patient identified, Emergency Drugs available, Suction available, Patient being monitored and Timeout performed ?Patient Re-evaluated:Patient Re-evaluated prior to induction ?Oxygen Delivery Method: Circle system utilized ?Preoxygenation: Pre-oxygenation with 100% oxygen ?Induction Type: IV induction ?Ventilation: Mask ventilation without difficulty ?Laryngoscope Size: Glidescope and 4 ?Grade View: Grade I ?Tube type: Oral ?Tube size: 7.0 mm ?Number of attempts: 1 ?Airway Equipment and Method: Stylet and Video-laryngoscopy ?Placement Confirmation: ETT inserted through vocal cords under direct vision, positive ETCO2 and breath sounds checked- equal and bilateral ?Secured at: 21 cm ?Tube secured with: Tape ?Dental Injury: Teeth and Oropharynx as per pre-operative assessment  ? ? ? ? ?

## 2021-06-09 NOTE — Brief Op Note (Signed)
06/09/2021 ? ?9:48 AM ? ?PATIENT:  Tammy Gregory  61 y.o. female ? ?PRE-OPERATIVE DIAGNOSIS:  Stenosis ? ?POST-OPERATIVE DIAGNOSIS:  Stenosis ? ?PROCEDURE:  Procedure(s): ?Anterior Cervical Discectomy Fusion - Cervical seven-Thoracic one (N/A) ? ?SURGEON:  Surgeon(s) and Role: ?   Earnie Larsson, MD - Primary ? ?PHYSICIAN ASSISTANT:  ? ?ASSISTANTS: Bergman,NP  ? ?ANESTHESIA:   general ? ?EBL:  100 mL  ? ?BLOOD ADMINISTERED:none ? ?DRAINS: (med) Hemovact drain(s) in the prevertebral space with  Suction Open  ? ?LOCAL MEDICATIONS USED:  NONE ? ?SPECIMEN:  No Specimen ? ?DISPOSITION OF SPECIMEN:  N/A ? ?COUNTS:  YES ? ?TOURNIQUET:  * No tourniquets in log * ? ?DICTATION: .Dragon Dictation ? ?PLAN OF CARE: Admit for overnight observation ? ?PATIENT DISPOSITION:  PACU - hemodynamically stable. ?  ?Delay start of Pharmacological VTE agent (>24hrs) due to surgical blood loss or risk of bleeding: yes ? ?

## 2021-06-10 ENCOUNTER — Encounter (HOSPITAL_COMMUNITY): Payer: Self-pay | Admitting: Neurosurgery

## 2021-06-10 DIAGNOSIS — M5013 Cervical disc disorder with radiculopathy, cervicothoracic region: Secondary | ICD-10-CM | POA: Diagnosis not present

## 2021-06-10 DIAGNOSIS — M4723 Other spondylosis with radiculopathy, cervicothoracic region: Secondary | ICD-10-CM | POA: Diagnosis not present

## 2021-06-10 DIAGNOSIS — I10 Essential (primary) hypertension: Secondary | ICD-10-CM | POA: Diagnosis not present

## 2021-06-10 DIAGNOSIS — M4713 Other spondylosis with myelopathy, cervicothoracic region: Secondary | ICD-10-CM | POA: Diagnosis not present

## 2021-06-10 DIAGNOSIS — Z79899 Other long term (current) drug therapy: Secondary | ICD-10-CM | POA: Diagnosis not present

## 2021-06-10 DIAGNOSIS — M5003 Cervical disc disorder with myelopathy, cervicothoracic region: Secondary | ICD-10-CM | POA: Diagnosis not present

## 2021-06-10 DIAGNOSIS — Z7984 Long term (current) use of oral hypoglycemic drugs: Secondary | ICD-10-CM | POA: Diagnosis not present

## 2021-06-10 MED ORDER — HYDROCODONE-ACETAMINOPHEN 10-325 MG PO TABS: 1.0000 | ORAL_TABLET | ORAL | 0 refills | Status: DC | PRN

## 2021-06-10 MED ORDER — CYCLOBENZAPRINE HCL 10 MG PO TABS: 10.0000 mg | ORAL_TABLET | Freq: Three times a day (TID) | ORAL | 0 refills | Status: DC | PRN

## 2021-06-10 NOTE — Discharge Instructions (Signed)
Wound Care Keep incision covered and dry for two days.    Do not put any creams, lotions, or ointments on incision. Leave steri-strips on back.  They will fall off by themselves. You are fine to shower. Let water run over incision and pat dry.  Activity Walk each and every day, increasing distance each day. No lifting greater than 5 lbs.  Avoid excessive neck motion. No driving for 2 weeks; may ride as a passenger locally.  Diet Resume your normal diet.   Return to Work Will be discussed at your follow up appointment.  Call Your Doctor If Any of These Occur Redness, drainage, or swelling at the wound.  Temperature greater than 101 degrees. Severe pain not relieved by pain medication. Incision starts to come apart.  Follow Up Appt Call [phone] today for appointment in 2-3 weeks if you don't already have one or for any problems. 

## 2021-06-10 NOTE — Discharge Summary (Signed)
Physician Discharge Summary  ? ? ? ?Providing Compassionate, Quality Care - Together ? ? ?Patient ID: ?Tammy Gregory ?MRN: [id number] ?DOB/AGE: 1960-07-30 61 y.o. ? ?Admit date: 06/09/2021 ?Discharge date: 06/10/2021 ? ?Admission Diagnoses: Cervical spondylosis with myelopathy and radiculopathy ? ?Discharge Diagnoses:  ?Principal Problem: ?  Cervical spondylosis with myelopathy and radiculopathy ? ? ?Discharged Condition: good ? ?Hospital Course: Patient underwent a C7-T1 ACDF by Dr. Annette Stable on 06/09/2021. She was admitted to 3C04 following recovery from anesthesia in the PACU. Her postoperative course has been uncomplicated. She is ambulating independently and without difficulty. She is tolerating a normal diet. She is not having any bowel or bladder dysfunction. Her pain is well-controlled with oral pain medication. She is ready for discharge home. ? ? ?Consults: None ? ?Significant Diagnostic Studies: radiology: DG Cervical Spine 2 or 3 views ? ?Result Date: 06/09/2021 ?CLINICAL DATA:  Anterior cervical discectomy and fusion EXAM: CERVICAL SPINE - 2-3 VIEW COMPARISON:  Cervical spine radiograph 05/19/2021 FINDINGS: Intraoperative fluoroscopic images during ACDF revision. Removal of upper anterior plating with retained intervertebral disc spacers. New ACDF at C7-T1. IMPRESSION: Intraoperative fluoroscopic images during ACDF revision. Electronically Signed   By: Maurine Simmering M.D.   On: 06/09/2021 10:02   ? ? ?Treatments: surgery: C7-T1 anterior cervical discectomy with interbody fusion utilizing interbody cage ([employer] anatomic peek cage), will curbside autograft, and anterior plate instrumentation (Atlantis anterior cervical plate, 13 mm variable angle screws) ? ?Discharge Exam: ?Blood pressure (!) 119/57, pulse 73, temperature 98.2 ?F (36.8 ?C), temperature source Oral, resp. rate 16, height '5\' 2"'$  (1.575 m), weight 81.6 kg, last menstrual period 04/02/2007, SpO2 100 %. ? ?Alert and oriented x 4 ?PERRLA ?Speech  clear ?CN II-XII grossly intact ?MAE, Strength and sensation intact ?Incision is covered with Honeycomb dressing and Steri Strips; Dressing is clean, dry, and intact ? ? ?Disposition: Discharge disposition: 01-Home or Self Care ? ? ? ? ? ? ? ?Allergies as of 06/10/2021   ? ?   Reactions  ? Codeine Nausea And Vomiting  ? Other Reaction: DIZZINESS.  ? Amoxicillin Diarrhea  ? Clindamycin Diarrhea  ? ?  ? ?  ?Medication List  ?  ? ?TAKE these medications   ? ?albuterol 108 (90 Base) MCG/ACT inhaler ?Commonly known as: VENTOLIN HFA ?TAKE 2 PUFFS BY MOUTH EVERY 6 HOURS AS NEEDED FOR WHEEZE OR SHORTNESS OF BREATH ?  ?amLODipine 5 MG tablet ?Commonly known as: NORVASC ?TAKE 1 TABLET BY MOUTH EVERY DAY ?  ?cyclobenzaprine 10 MG tablet ?Commonly known as: FLEXERIL ?Take 1 tablet (10 mg total) by mouth 3 (three) times daily as needed for muscle spasms. ?  ?escitalopram 10 MG tablet ?Commonly known as: LEXAPRO ?TAKE 1 TABLET BY MOUTH EVERY DAY ?What changed: when to take this ?  ?gabapentin 300 MG capsule ?Commonly known as: NEURONTIN ?Take 1 capsule (300 mg total) by mouth at bedtime. ?  ?HYDROcodone-acetaminophen 10-325 MG tablet ?Commonly known as: NORCO ?Take 1 tablet by mouth every 4 (four) hours as needed for severe pain ((score 7 to 10)). ?  ?Melatonin 10 MG Tabs ?Take 10 mg by mouth at bedtime. ?  ?meloxicam 15 MG tablet ?Commonly known as: MOBIC ?TAKE 1 TABLET BY MOUTH EVERY DAY AS NEEDED FOR PAIN ?What changed: See the new instructions. ?  ?metFORMIN 500 MG 24 hr tablet ?Commonly known as: GLUCOPHAGE-XR ?Take 500 mg by mouth every evening. ?  ?Phentermine-Topiramate 15-92 MG Cp24 ?Take 1 tablet by mouth daily. ?  ? ?  ? ?  Follow-up Information   ? ? Earnie Larsson, MD. Daphane Shepherd on 06/21/2021.   ?Specialty: Neurosurgery ?Why: First post op appointment is on 06/21/2021 at 3:45 pm. ?Contact information: ?[address] ?[address] ?[place] [address] ?[phone] ? ? ?  ?  ? ?  ?  ? ?  ? ? ?Signed: ?Viona Gilmore, DNP,  AGNP-C ?Nurse Practitioner ? ?[facility] ?[address], [place], [place] [address] ?P: [phone]    F: [phone] ? ?06/10/2021, 8:23 AM ? ?

## 2021-06-27 ENCOUNTER — Other Ambulatory Visit: Payer: Self-pay | Admitting: Family Medicine

## 2021-06-28 NOTE — Telephone Encounter (Signed)
I spoke with patient & she is scheduled for follow-up this Friday so I will refill Celexa for 30 days for now.  ?

## 2021-07-01 ENCOUNTER — Ambulatory Visit (INDEPENDENT_AMBULATORY_CARE_PROVIDER_SITE_OTHER): Payer: Federal, State, Local not specified - PPO | Admitting: Family Medicine

## 2021-07-01 ENCOUNTER — Encounter: Payer: Self-pay | Admitting: Family Medicine

## 2021-07-01 VITALS — BP 130/80 | HR 83 | Temp 98.6°F | Ht 62.0 in | Wt 181.0 lb

## 2021-07-01 DIAGNOSIS — Z6836 Body mass index (BMI) 36.0-36.9, adult: Secondary | ICD-10-CM | POA: Diagnosis not present

## 2021-07-01 DIAGNOSIS — F419 Anxiety disorder, unspecified: Secondary | ICD-10-CM

## 2021-07-01 DIAGNOSIS — Z981 Arthrodesis status: Secondary | ICD-10-CM

## 2021-07-01 DIAGNOSIS — F32A Depression, unspecified: Secondary | ICD-10-CM

## 2021-07-01 DIAGNOSIS — I10 Essential (primary) hypertension: Secondary | ICD-10-CM

## 2021-07-01 NOTE — Patient Instructions (Signed)
Nice to see you. We will get lab work today and contact you with the results. 

## 2021-07-01 NOTE — Assessment & Plan Note (Signed)
Continues to have some issues with this.  I offered support.  She will remain on Lexapro 10 mg once daily.  She will let me know if her memory issues return despite coming off of gabapentin. ?

## 2021-07-01 NOTE — Assessment & Plan Note (Signed)
Patient reports significant improvement in her symptoms following cervical fusion.  She will continue to monitor.  I advised against seeing a chiropractor. ?

## 2021-07-01 NOTE — Assessment & Plan Note (Signed)
Adequately controlled.  She will continue amlodipine 5 mg once daily.  I encouraged her to start checking her blood pressure at home. ?

## 2021-07-01 NOTE — Progress Notes (Signed)
?Tommi Rumps, MD ?Phone: [phone] ? ?Tammy Gregory is a 61 y.o. female who presents today for follow-up. ? ?HYPERTENSION ?Disease Monitoring ?Home BP Monitoring not checking Chest pain- no    Dyspnea- no ?Medications ?Compliance-  taking amlodipine  Edema- no ?BMET ?   ?Component Value Date/Time  ? NA 141 06/06/2021 0942  ? K 4.4 06/06/2021 0942  ? CL 112 (H) 06/06/2021 7793  ? CO2 25 06/06/2021 0942  ? GLUCOSE 92 06/06/2021 0942  ? BUN 19 06/06/2021 0942  ? CREATININE 1.01 (H) 06/06/2021 9030  ? CALCIUM 9.1 06/06/2021 0942  ? GFRNONAA >60 06/06/2021 0942  ? GFRAA >60 03/01/2016 2059  ? ?Status post cervical fusion: Patient had significant neck and shoulder pain.  It took a long time for her to get an MRI though eventually she had a MRI and improved nerve impingement.  She noted her symptoms have improved significantly after having her fusion.  She wondered about seeing a chiropractor. ? ?Anxiety/depression: Patient notes she has a lot of anxiety surrounding her relationship with her husband and caring for her father who got extremely sick after he started lung cancer treatment.  Depression seems to be fairly controlled.  No SI.  She tried cutting her Lexapro down to 5 mg daily though got extremely jittery and went back to the 10 mg dose.  She did report some memory issues though she stopped gabapentin and noted the memory issues seem to have resolved. ? ?Social History  ? ?Tobacco Use  ?Smoking Status Never  ?Smokeless Tobacco Never  ? ? ?Current Outpatient Medications on File Prior to Visit  ?Medication Sig Dispense Refill  ? amLODipine (NORVASC) 5 MG tablet TAKE 1 TABLET BY MOUTH EVERY DAY 90 tablet 3  ? cyclobenzaprine (FLEXERIL) 10 MG tablet Take 1 tablet (10 mg total) by mouth 3 (three) times daily as needed for muscle spasms. 30 tablet 0  ? escitalopram (LEXAPRO) 10 MG tablet TAKE 1 TABLET BY MOUTH EVERY DAY 30 tablet 0  ? Melatonin 10 MG TABS Take 10 mg by mouth at bedtime.    ? meloxicam  (MOBIC) 15 MG tablet TAKE 1 TABLET BY MOUTH EVERY DAY AS NEEDED FOR PAIN (Patient taking differently: Take 15 mg by mouth at bedtime.) 30 tablet 2  ? metFORMIN (GLUCOPHAGE-XR) 500 MG 24 hr tablet Take 500 mg by mouth every evening.    ? albuterol (VENTOLIN HFA) 108 (90 Base) MCG/ACT inhaler TAKE 2 PUFFS BY MOUTH EVERY 6 HOURS AS NEEDED FOR WHEEZE OR SHORTNESS OF BREATH (Patient not taking: Reported on 07/01/2021) 8.5 each 0  ? gabapentin (NEURONTIN) 300 MG capsule Take 1 capsule (300 mg total) by mouth at bedtime. (Patient not taking: Reported on 07/01/2021) 90 capsule 1  ? HYDROcodone-acetaminophen (NORCO) 10-325 MG tablet Take 1 tablet by mouth every 4 (four) hours as needed for severe pain ((score 7 to 10)). (Patient not taking: Reported on 07/01/2021) 30 tablet 0  ? Phentermine-Topiramate 15-92 MG CP24 Take 1 tablet by mouth daily.    ? ?No current facility-administered medications on file prior to visit.  ? ? ? ?ROS see history of present illness ? ?Objective ? ?Physical Exam ?Vitals:  ? 07/01/21 1459  ?BP: 130/80  ?Pulse: 83  ?Temp: 98.6 ?F (37 ?C)  ?SpO2: 97%  ? ? ?BP Readings from Last 3 Encounters:  ?07/01/21 130/80  ?06/10/21 (!) 119/57  ?06/06/21 136/79  ? ?Wt Readings from Last 3 Encounters:  ?07/01/21 181 lb (82.1 kg)  ?06/09/21 180  lb (81.6 kg)  ?06/06/21 182 lb 4.8 oz (82.7 kg)  ? ? ?Physical Exam ?Constitutional:   ?   General: She is not in acute distress. ?   Appearance: She is not diaphoretic.  ?Cardiovascular:  ?   Rate and Rhythm: Normal rate and regular rhythm.  ?   Heart sounds: Normal heart sounds.  ?Pulmonary:  ?   Effort: Pulmonary effort is normal.  ?   Breath sounds: Normal breath sounds.  ?Skin: ?   General: Skin is warm and dry.  ?Neurological:  ?   Mental Status: She is alert.  ?   Comments: 5/5 strength in bilateral biceps, triceps, grip, quads, hamstrings, plantar and dorsiflexion, sensation to light touch intact in bilateral UE and LE, normal gait  ? ? ? ?Assessment/Plan: Please see  individual problem list. ? ?Problem List Items Addressed This Visit   ? ? Anxiety and depression (Chronic)  ?  Continues to have some issues with this.  I offered support.  She will remain on Lexapro 10 mg once daily.  She will let me know if her memory issues return despite coming off of gabapentin. ? ?  ?  ? Hypertension - Primary (Chronic)  ?  Adequately controlled.  She will continue amlodipine 5 mg once daily.  I encouraged her to start checking her blood pressure at home. ? ?  ?  ? Relevant Orders  ? Comp Met (CMET)  ? Lipid panel  ? Obesity  ? Relevant Orders  ? HgB A1c  ? Lipid panel  ? S/P cervical spinal fusion  ?  Patient reports significant improvement in her symptoms following cervical fusion.  She will continue to monitor.  I advised against seeing a chiropractor. ? ?  ?  ? ? ? ?Return in about 6 months (around 12/31/2021) for Anxiety/depression follow-up. ? ?This visit occurred during the SARS-CoV-2 public health emergency.  Safety protocols were in place, including screening questions prior to the visit, additional usage of staff PPE, and extensive cleaning of exam room while observing appropriate contact time as indicated for disinfecting solutions.  ? ? ?Tommi Rumps, MD ?[place] ? ?

## 2021-07-02 LAB — COMPREHENSIVE METABOLIC PANEL
AG Ratio: 1.8 (calc) (ref 1.0–2.5)
ALT: 13 U/L (ref 6–29)
AST: 16 U/L (ref 10–35)
Albumin: 4.3 g/dL (ref 3.6–5.1)
Alkaline phosphatase (APISO): 109 U/L (ref 37–153)
BUN: 22 mg/dL (ref 7–25)
CO2: 26 mmol/L (ref 20–32)
Calcium: 9.3 mg/dL (ref 8.6–10.4)
Chloride: 109 mmol/L (ref 98–110)
Creat: 0.85 mg/dL (ref 0.50–1.05)
Globulin: 2.4 g/dL (calc) (ref 1.9–3.7)
Glucose, Bld: 96 mg/dL (ref 65–99)
Potassium: 4.4 mmol/L (ref 3.5–5.3)
Sodium: 142 mmol/L (ref 135–146)
Total Bilirubin: 0.5 mg/dL (ref 0.2–1.2)
Total Protein: 6.7 g/dL (ref 6.1–8.1)

## 2021-07-02 LAB — LIPID PANEL
Cholesterol: 181 mg/dL (ref <200)
HDL: 62 mg/dL (ref >=50)
LDL Cholesterol (Calc): 105 mg/dL (calc) — ABNORMAL HIGH
Non-HDL Cholesterol (Calc): 119 mg/dL (calc) (ref <130)
Total CHOL/HDL Ratio: 2.9 (calc) (ref <5.0)
Triglycerides: 58 mg/dL (ref <150)

## 2021-07-02 LAB — HEMOGLOBIN A1C
Hgb A1c MFr Bld: 5.3 % of total Hgb (ref <5.7)
Mean Plasma Glucose: 105 mg/dL
eAG (mmol/L): 5.8 mmol/L

## 2021-07-14 DIAGNOSIS — M5412 Radiculopathy, cervical region: Secondary | ICD-10-CM | POA: Diagnosis not present

## 2021-07-23 ENCOUNTER — Other Ambulatory Visit: Payer: Self-pay | Admitting: Family Medicine

## 2021-07-27 DIAGNOSIS — M25562 Pain in left knee: Secondary | ICD-10-CM | POA: Diagnosis not present

## 2021-07-27 DIAGNOSIS — M25561 Pain in right knee: Secondary | ICD-10-CM | POA: Diagnosis not present

## 2021-07-27 DIAGNOSIS — G8929 Other chronic pain: Secondary | ICD-10-CM | POA: Diagnosis not present

## 2021-08-18 DIAGNOSIS — M5412 Radiculopathy, cervical region: Secondary | ICD-10-CM | POA: Diagnosis not present

## 2021-10-10 DIAGNOSIS — K219 Gastro-esophageal reflux disease without esophagitis: Secondary | ICD-10-CM | POA: Diagnosis not present

## 2021-10-10 DIAGNOSIS — R1314 Dysphagia, pharyngoesophageal phase: Secondary | ICD-10-CM | POA: Diagnosis not present

## 2021-10-12 ENCOUNTER — Other Ambulatory Visit: Payer: Self-pay | Admitting: Family Medicine

## 2021-10-16 ENCOUNTER — Encounter: Payer: Self-pay | Admitting: Family Medicine

## 2021-10-19 DIAGNOSIS — M9902 Segmental and somatic dysfunction of thoracic region: Secondary | ICD-10-CM | POA: Diagnosis not present

## 2021-10-19 DIAGNOSIS — M62838 Other muscle spasm: Secondary | ICD-10-CM | POA: Diagnosis not present

## 2021-10-19 DIAGNOSIS — M9901 Segmental and somatic dysfunction of cervical region: Secondary | ICD-10-CM | POA: Diagnosis not present

## 2021-10-19 DIAGNOSIS — M542 Cervicalgia: Secondary | ICD-10-CM | POA: Diagnosis not present

## 2021-10-20 ENCOUNTER — Ambulatory Visit (INDEPENDENT_AMBULATORY_CARE_PROVIDER_SITE_OTHER): Payer: Federal, State, Local not specified - PPO | Admitting: Family Medicine

## 2021-10-20 ENCOUNTER — Encounter: Payer: Self-pay | Admitting: Family Medicine

## 2021-10-20 VITALS — BP 124/76 | HR 64 | Temp 98.3°F | Ht 62.0 in | Wt 178.8 lb

## 2021-10-20 DIAGNOSIS — I10 Essential (primary) hypertension: Secondary | ICD-10-CM | POA: Diagnosis not present

## 2021-10-20 DIAGNOSIS — G5603 Carpal tunnel syndrome, bilateral upper limbs: Secondary | ICD-10-CM | POA: Diagnosis not present

## 2021-10-20 MED ORDER — MELOXICAM 15 MG PO TABS: 7.5000 mg | ORAL_TABLET | Freq: Every day | ORAL | 1 refills | Status: DC | PRN

## 2021-10-20 MED ORDER — SHINGRIX 50 MCG/0.5ML IM SUSR: 0.5000 mL | Freq: Once | INTRAMUSCULAR | 1 refills | Status: AC

## 2021-10-20 NOTE — Patient Instructions (Addendum)
It was a pleasure meeting you today. Thank you for allowing me to take part in your health care.  Our goals for today as we discussed include:  For your hands and legs Restart your Gabapentin 300 mg at night. Decrease your Meloxicam to 7.5 mg as needed. Use your wrist splints at night  Decrease repetitive motion  Use compression stockings daily Elevate legs when sitting Limit salt intake  For your current stress at home Continue Lexapro 10 mg daily Recommend that you schedule an appointment with a therapist to discuss your current stressful life event.  Recommend Shingles vaccine.  This is a 2 dose series and can be given at your local pharmacy.  Please talk to your pharmacist about this.  Please follow-up with PCP as needed or sooner if symptoms worsen  If you have any questions or concerns, please do not hesitate to call the office at [phone].  I look forward to our next visit and until then take care and stay safe.  Regards,   Carollee Leitz, MD   [facility]

## 2021-10-20 NOTE — Progress Notes (Unsigned)
    SUBJECTIVE:   CHIEF COMPLAINT / HPI: hand and leg cramps  Patient repots bilateral hand cramps for 2 weeks.  Also endorses bilateral leg cramping that started same time.  Reports when closing fists hands would hurt more.  She is not currently having pain or cramps today in her hands or legs.  She reports having stopped her Gabapentin about 1 month ago and the pain started a couple of weeks after discontinuing medication.  Has not been using wrist splints at night. She is currently going through a divorce and has moved to a new apartment. This has been a huge adjustment for her.  Denies any fevers, joint swelling, weakness, worsening numbness or tingling.  Denies any SI/HI.    PERTINENT  PMH / PSH:  Bilateral Carpel Tunnel Bilateral sciatica HTN Anxiety/Depression  OBJECTIVE:   BP 124/76 (BP Location: Left Arm, Patient Position: Sitting, Cuff Size: Normal)   Pulse 64   Temp 98.3 F (36.8 C) (Oral)   Ht '5\' 2"'$  (1.575 m)   Wt 178 lb 12.8 oz (81.1 kg)   LMP 04/02/2007 (Approximate)   SpO2 99%   BMI 32.70 kg/m    General: Alert, no acute distress Cardio: Normal S1 and S2, RRR, no r/m/g Pulm: CTAB, normal work of breathing Abdomen: Bowel sounds normal. Abdomen soft and non-tender.  Extremities: No peripheral edema.  Bilateral Hand/Fingers/Wrist Exam: Inspection: no swelling, no bony deformity or atrophy of the hypothenar region  Palpation: nontender to palpation of DIP, PIP, MTP, scaphoid/snuff box, scaphoid tubercle  AROM/PROM: Full flexion, extension, supination, pronation  Strength: 5/5 flexion, 5/5 extension, 5/5 pronation, 5/5 supination Special tests: (-) CMC grind, (-) F[place]in, (+) Tinel at wrist, (-) Phalen  Neuro: Cranial nerves grossly intact   ASSESSMENT/PLAN:   Bilateral carpal tunnel syndrome Suspect flare of carpal tunnel given self discontinuation of medication. -Recommend restart Gabapentin 300 mg qhs -Decrease Meloxicam to 7.5 mg daily as needed -Wrist  splints at night -Avoid repetitive movement -Follow up with PCP in 4 weeks if no improvement  Hypertension Well controlled Continue Amlodipine 5 mg daily  HCM Shingles vaccine prescription sent  PDMP reviewed  Carollee Leitz, MD

## 2021-10-21 DIAGNOSIS — F32A Depression, unspecified: Secondary | ICD-10-CM | POA: Diagnosis not present

## 2021-10-21 DIAGNOSIS — Z713 Dietary counseling and surveillance: Secondary | ICD-10-CM | POA: Diagnosis not present

## 2021-10-21 DIAGNOSIS — Z1211 Encounter for screening for malignant neoplasm of colon: Secondary | ICD-10-CM | POA: Diagnosis not present

## 2021-10-21 DIAGNOSIS — F419 Anxiety disorder, unspecified: Secondary | ICD-10-CM | POA: Diagnosis not present

## 2021-10-27 ENCOUNTER — Encounter: Payer: Self-pay | Admitting: Family Medicine

## 2021-10-27 DIAGNOSIS — M9901 Segmental and somatic dysfunction of cervical region: Secondary | ICD-10-CM | POA: Diagnosis not present

## 2021-10-27 DIAGNOSIS — M62838 Other muscle spasm: Secondary | ICD-10-CM | POA: Diagnosis not present

## 2021-10-27 DIAGNOSIS — M542 Cervicalgia: Secondary | ICD-10-CM | POA: Diagnosis not present

## 2021-10-27 DIAGNOSIS — M9902 Segmental and somatic dysfunction of thoracic region: Secondary | ICD-10-CM | POA: Diagnosis not present

## 2021-10-27 NOTE — Assessment & Plan Note (Signed)
Well controlled Continue Amlodipine 5 mg daily

## 2021-10-27 NOTE — Assessment & Plan Note (Addendum)
Suspect flare of carpal tunnel given self discontinuation of medication. -Recommend restart Gabapentin 300 mg qhs -Decrease Meloxicam to 7.5 mg daily as needed -Wrist splints at night -Avoid repetitive movement -Follow up with PCP in 4 weeks if no improvement

## 2021-11-02 DIAGNOSIS — M5412 Radiculopathy, cervical region: Secondary | ICD-10-CM | POA: Diagnosis not present

## 2021-11-16 DIAGNOSIS — M542 Cervicalgia: Secondary | ICD-10-CM | POA: Diagnosis not present

## 2021-11-16 DIAGNOSIS — M9902 Segmental and somatic dysfunction of thoracic region: Secondary | ICD-10-CM | POA: Diagnosis not present

## 2021-11-16 DIAGNOSIS — M62838 Other muscle spasm: Secondary | ICD-10-CM | POA: Diagnosis not present

## 2021-11-16 DIAGNOSIS — M9901 Segmental and somatic dysfunction of cervical region: Secondary | ICD-10-CM | POA: Diagnosis not present

## 2021-11-21 DIAGNOSIS — M62838 Other muscle spasm: Secondary | ICD-10-CM | POA: Diagnosis not present

## 2021-11-21 DIAGNOSIS — M9901 Segmental and somatic dysfunction of cervical region: Secondary | ICD-10-CM | POA: Diagnosis not present

## 2021-11-21 DIAGNOSIS — M542 Cervicalgia: Secondary | ICD-10-CM | POA: Diagnosis not present

## 2021-11-21 DIAGNOSIS — M9902 Segmental and somatic dysfunction of thoracic region: Secondary | ICD-10-CM | POA: Diagnosis not present

## 2021-11-24 ENCOUNTER — Other Ambulatory Visit: Payer: Self-pay | Admitting: Family Medicine

## 2021-11-26 DIAGNOSIS — M9901 Segmental and somatic dysfunction of cervical region: Secondary | ICD-10-CM | POA: Diagnosis not present

## 2021-11-26 DIAGNOSIS — M62838 Other muscle spasm: Secondary | ICD-10-CM | POA: Diagnosis not present

## 2021-11-26 DIAGNOSIS — M9902 Segmental and somatic dysfunction of thoracic region: Secondary | ICD-10-CM | POA: Diagnosis not present

## 2021-11-26 DIAGNOSIS — M542 Cervicalgia: Secondary | ICD-10-CM | POA: Diagnosis not present

## 2021-11-28 DIAGNOSIS — M62838 Other muscle spasm: Secondary | ICD-10-CM | POA: Diagnosis not present

## 2021-11-28 DIAGNOSIS — M9901 Segmental and somatic dysfunction of cervical region: Secondary | ICD-10-CM | POA: Diagnosis not present

## 2021-11-28 DIAGNOSIS — M542 Cervicalgia: Secondary | ICD-10-CM | POA: Diagnosis not present

## 2021-11-28 DIAGNOSIS — M9902 Segmental and somatic dysfunction of thoracic region: Secondary | ICD-10-CM | POA: Diagnosis not present

## 2021-11-30 DIAGNOSIS — M542 Cervicalgia: Secondary | ICD-10-CM | POA: Diagnosis not present

## 2021-11-30 DIAGNOSIS — M9902 Segmental and somatic dysfunction of thoracic region: Secondary | ICD-10-CM | POA: Diagnosis not present

## 2021-11-30 DIAGNOSIS — M9901 Segmental and somatic dysfunction of cervical region: Secondary | ICD-10-CM | POA: Diagnosis not present

## 2021-11-30 DIAGNOSIS — M62838 Other muscle spasm: Secondary | ICD-10-CM | POA: Diagnosis not present

## 2021-12-05 DIAGNOSIS — M9902 Segmental and somatic dysfunction of thoracic region: Secondary | ICD-10-CM | POA: Diagnosis not present

## 2021-12-05 DIAGNOSIS — M62838 Other muscle spasm: Secondary | ICD-10-CM | POA: Diagnosis not present

## 2021-12-05 DIAGNOSIS — M542 Cervicalgia: Secondary | ICD-10-CM | POA: Diagnosis not present

## 2021-12-05 DIAGNOSIS — M9901 Segmental and somatic dysfunction of cervical region: Secondary | ICD-10-CM | POA: Diagnosis not present

## 2021-12-07 DIAGNOSIS — M9902 Segmental and somatic dysfunction of thoracic region: Secondary | ICD-10-CM | POA: Diagnosis not present

## 2021-12-07 DIAGNOSIS — M542 Cervicalgia: Secondary | ICD-10-CM | POA: Diagnosis not present

## 2021-12-07 DIAGNOSIS — M9901 Segmental and somatic dysfunction of cervical region: Secondary | ICD-10-CM | POA: Diagnosis not present

## 2021-12-07 DIAGNOSIS — M62838 Other muscle spasm: Secondary | ICD-10-CM | POA: Diagnosis not present

## 2021-12-10 DIAGNOSIS — M9901 Segmental and somatic dysfunction of cervical region: Secondary | ICD-10-CM | POA: Diagnosis not present

## 2021-12-10 DIAGNOSIS — M62838 Other muscle spasm: Secondary | ICD-10-CM | POA: Diagnosis not present

## 2021-12-10 DIAGNOSIS — M9902 Segmental and somatic dysfunction of thoracic region: Secondary | ICD-10-CM | POA: Diagnosis not present

## 2021-12-10 DIAGNOSIS — M542 Cervicalgia: Secondary | ICD-10-CM | POA: Diagnosis not present

## 2021-12-14 ENCOUNTER — Encounter: Payer: Self-pay | Admitting: Family Medicine

## 2021-12-15 DIAGNOSIS — M9901 Segmental and somatic dysfunction of cervical region: Secondary | ICD-10-CM | POA: Diagnosis not present

## 2021-12-15 DIAGNOSIS — M62838 Other muscle spasm: Secondary | ICD-10-CM | POA: Diagnosis not present

## 2021-12-15 DIAGNOSIS — M542 Cervicalgia: Secondary | ICD-10-CM | POA: Diagnosis not present

## 2021-12-15 DIAGNOSIS — M9902 Segmental and somatic dysfunction of thoracic region: Secondary | ICD-10-CM | POA: Diagnosis not present

## 2021-12-16 ENCOUNTER — Encounter: Payer: Self-pay | Admitting: Family Medicine

## 2021-12-16 ENCOUNTER — Ambulatory Visit (INDEPENDENT_AMBULATORY_CARE_PROVIDER_SITE_OTHER): Payer: Federal, State, Local not specified - PPO | Admitting: Family Medicine

## 2021-12-16 DIAGNOSIS — F419 Anxiety disorder, unspecified: Secondary | ICD-10-CM

## 2021-12-16 DIAGNOSIS — F32A Depression, unspecified: Secondary | ICD-10-CM | POA: Diagnosis not present

## 2021-12-16 MED ORDER — DULOXETINE HCL 30 MG PO CPEP: ORAL_CAPSULE | ORAL | 1 refills | Status: DC

## 2021-12-16 MED ORDER — TRAZODONE HCL 50 MG PO TABS: 25.0000 mg | ORAL_TABLET | Freq: Every evening | ORAL | 3 refills | Status: DC | PRN

## 2021-12-16 NOTE — Patient Instructions (Signed)
Nice to see you. We will start Cymbalta in place of your Lexapro. Please try the trazodone for sleep.  If its not beneficial please let me know.

## 2021-12-16 NOTE — Assessment & Plan Note (Signed)
Exacerbation of a chronic issue.  We will transition her from Lexapro to Cymbalta.  She will make a direct switch and start Cymbalta 30 mg once daily for 14 days followed by an increase to 60 mg daily.  We will treat her sleep issues with trazodone 25-50 mg nightly as needed for sleep.  If its not beneficial she will let us know.  If she has side effects with either medication she will let us know.

## 2021-12-16 NOTE — Progress Notes (Signed)
Tommi Rumps, MD Phone: [phone]  KOBI ALLER Tammy Gregory is a 61 y.o. female who presents today for follow-up.  Anxiety/depression: Patient has this has gotten worse recently.  She is going through separation and divorce.  She notes no SI or HI.  She notes this issue is affecting her work.  She has trouble unwinding at night and cannot go to bed until 2 AM.  She gets 4 to 5 hours of sleep nightly.  She is procrastinating at work.  She has binge eating as well.  She is currently on Lexapro and feels as though this is not working all that well for her.  She has taken trazodone in the past and she had dreams that she did not sleep well with this.  Social History   Tobacco Use  Smoking Status Never  Smokeless Tobacco Never    Current Outpatient Medications on File Prior to Visit  Medication Sig Dispense Refill   amLODipine (NORVASC) 5 MG tablet TAKE 1 TABLET BY MOUTH EVERY DAY 90 tablet 3   gabapentin (NEURONTIN) 300 MG capsule Take 1 capsule (300 mg total) by mouth at bedtime. 90 capsule 1   Melatonin 10 MG TABS Take 10 mg by mouth at bedtime.     meloxicam (MOBIC) 15 MG tablet Take 0.5 tablets (7.5 mg total) by mouth daily as needed for pain. 30 tablet 1   metFORMIN (GLUCOPHAGE-XR) 500 MG 24 hr tablet Take 500 mg by mouth every evening.     QSYMIA 7.5-46 MG CP24 Take 1 capsule by mouth every morning.     No current facility-administered medications on file prior to visit.     ROS see history of present illness  Objective  Physical Exam Vitals:   12/16/21 1126  BP: 120/78  Pulse: 80  Temp: 98.6 F (37 C)  SpO2: 94%    BP Readings from Last 3 Encounters:  12/16/21 120/78  10/20/21 124/76  07/01/21 130/80   Wt Readings from Last 3 Encounters:  12/16/21 185 lb (83.9 kg)  10/20/21 178 lb 12.8 oz (81.1 kg)  07/01/21 181 lb (82.1 kg)    Physical Exam Constitutional:      General: She is not in acute distress.    Appearance: She is not diaphoretic.   Cardiovascular:     Rate and Rhythm: Normal rate and regular rhythm.     Heart sounds: Normal heart sounds.  Pulmonary:     Effort: Pulmonary effort is normal.     Breath sounds: Normal breath sounds.  Skin:    General: Skin is warm and dry.  Neurological:     Mental Status: She is alert.      Assessment/Plan: Please see individual problem list.  Problem List Items Addressed This Visit     Anxiety and depression (Chronic)    Exacerbation of a chronic issue.  We will transition her from Lexapro to Cymbalta.  She will make a direct switch and start Cymbalta 30 mg once daily for 14 days followed by an increase to 60 mg daily.  We will treat her sleep issues with trazodone 25-50 mg nightly as needed for sleep.  If its not beneficial she will let us know.  If she has side effects with either medication she will let us know.      Relevant Medications   DULoxetine (CYMBALTA) 30 MG capsule   traZODone (DESYREL) 50 MG tablet   Patient notes I also see her husband whom she is getting a divorce from.  She  asks that I let her know if it ever becomes an issue seeing both of them and she would switch providers.  Advised this time that it is not an issue.   Return in about 6 weeks (around 01/27/2022) for Anxiety/depression follow-up.   Tommi Rumps, MD [place]

## 2021-12-17 DIAGNOSIS — M542 Cervicalgia: Secondary | ICD-10-CM | POA: Diagnosis not present

## 2021-12-17 DIAGNOSIS — M9901 Segmental and somatic dysfunction of cervical region: Secondary | ICD-10-CM | POA: Diagnosis not present

## 2021-12-17 DIAGNOSIS — M62838 Other muscle spasm: Secondary | ICD-10-CM | POA: Diagnosis not present

## 2021-12-17 DIAGNOSIS — M9902 Segmental and somatic dysfunction of thoracic region: Secondary | ICD-10-CM | POA: Diagnosis not present

## 2021-12-19 ENCOUNTER — Telehealth: Payer: Federal, State, Local not specified - PPO | Admitting: Family Medicine

## 2021-12-21 DIAGNOSIS — M62830 Muscle spasm of back: Secondary | ICD-10-CM | POA: Diagnosis not present

## 2021-12-21 DIAGNOSIS — M9901 Segmental and somatic dysfunction of cervical region: Secondary | ICD-10-CM | POA: Diagnosis not present

## 2021-12-21 DIAGNOSIS — M62838 Other muscle spasm: Secondary | ICD-10-CM | POA: Diagnosis not present

## 2021-12-21 DIAGNOSIS — M542 Cervicalgia: Secondary | ICD-10-CM | POA: Diagnosis not present

## 2021-12-21 DIAGNOSIS — M9902 Segmental and somatic dysfunction of thoracic region: Secondary | ICD-10-CM | POA: Diagnosis not present

## 2022-01-07 ENCOUNTER — Other Ambulatory Visit: Payer: Self-pay | Admitting: Family Medicine

## 2022-01-07 DIAGNOSIS — F419 Anxiety disorder, unspecified: Secondary | ICD-10-CM

## 2022-01-29 IMAGING — DX DG SHOULDER 2+V*L*
3 series · 3 of 3 positions shown · non-contrast
Comparison: None.

CLINICAL DATA: Atraumatic left shoulder pain.

EXAM:
LEFT SHOULDER - 2+ VIEW

[shoulder (grashey) ap]
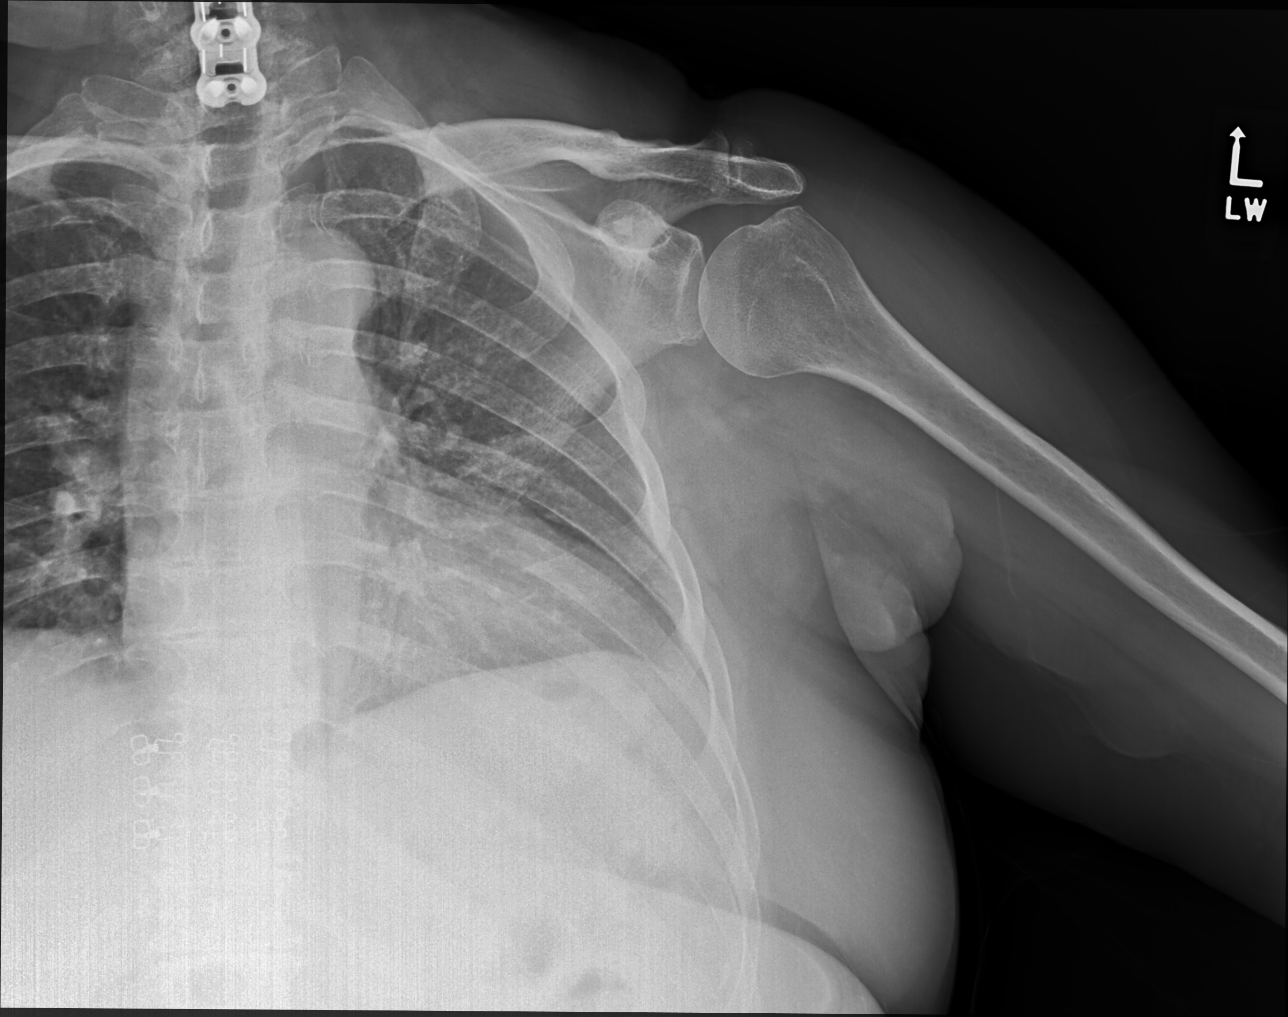

[shoulder y view]
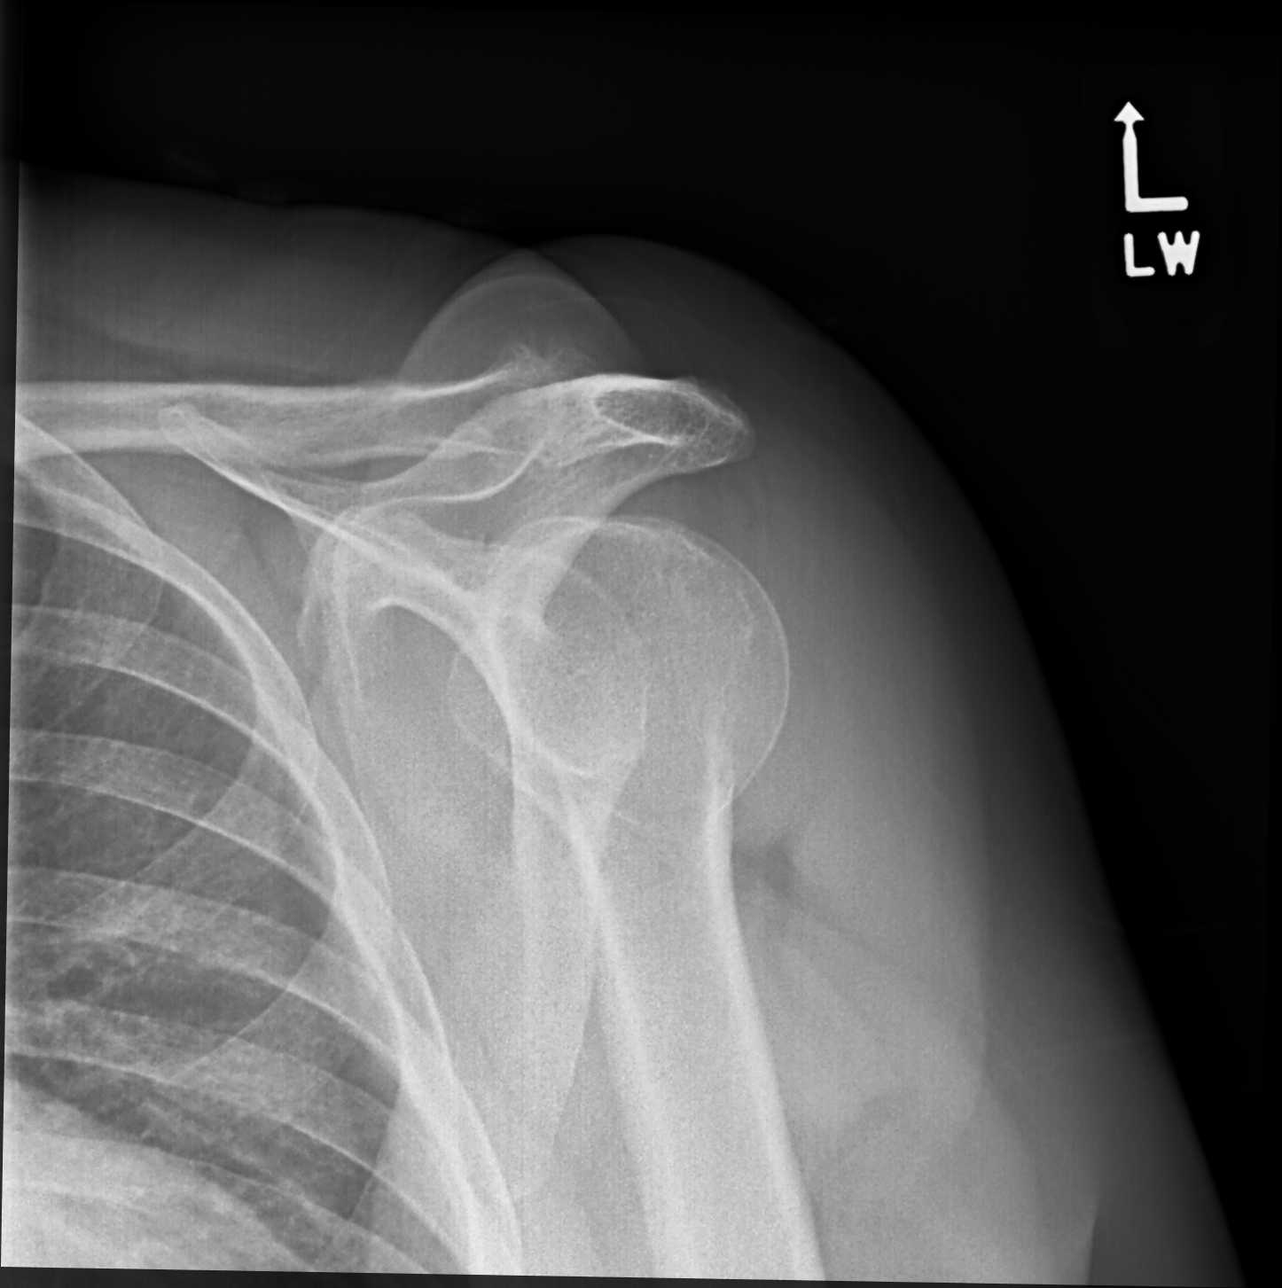

[shoulder axillary pa]
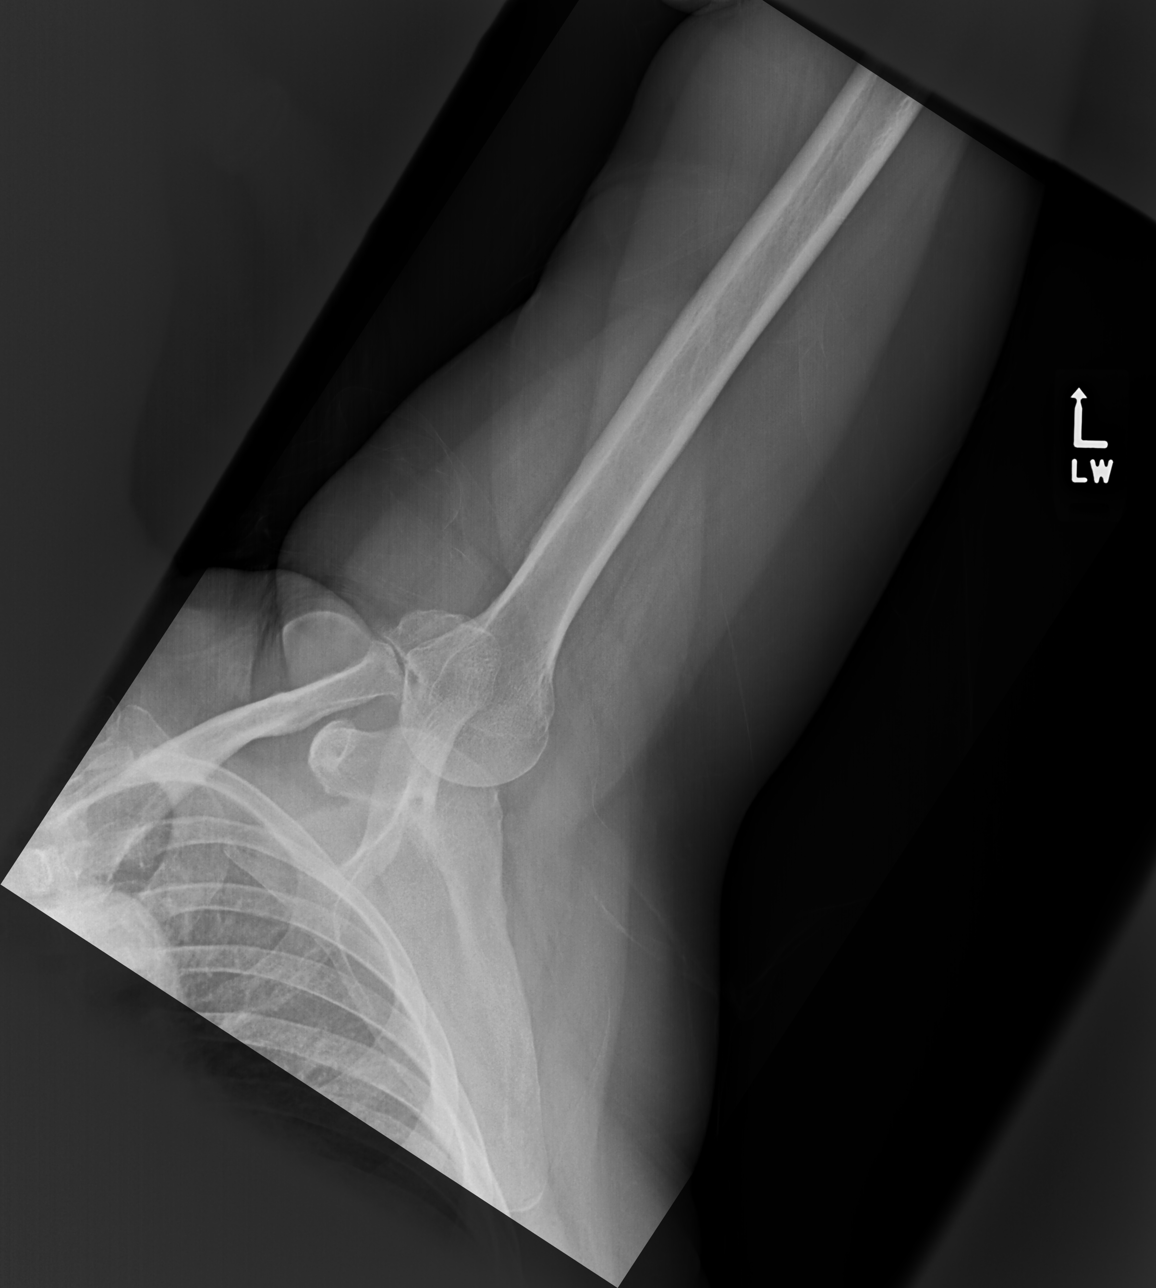

[3 of 3 positions shown; findings below may reference images not displayed]

FINDINGS: There is no evidence of fracture or dislocation. Mild degenerative
changes are seen involving the left acromioclavicular joint. A
radiopaque fusion plate and screws are seen within the visualized
portion of the lower cervical spine. Soft tissues are unremarkable.
IMPRESSION: Mild degenerative changes involving the left acromioclavicular
joint.

## 2022-01-31 DIAGNOSIS — M65331 Trigger finger, right middle finger: Secondary | ICD-10-CM | POA: Diagnosis not present

## 2022-01-31 DIAGNOSIS — M65332 Trigger finger, left middle finger: Secondary | ICD-10-CM | POA: Diagnosis not present

## 2022-02-08 ENCOUNTER — Ambulatory Visit: Payer: Federal, State, Local not specified - PPO | Admitting: Family Medicine

## 2022-02-10 DIAGNOSIS — M170 Bilateral primary osteoarthritis of knee: Secondary | ICD-10-CM | POA: Diagnosis not present

## 2022-02-17 ENCOUNTER — Telehealth: Payer: Self-pay

## 2022-02-17 NOTE — Telephone Encounter (Signed)
Pt called in stating she missed a medication f/u appt with Dr. Caryl Bis and wanted to reschedule. I scheduled the pt at 03/15/22 @ 430 pm as that was the earliest availability.   Pt stated she had a cramp last night and it lasted majority of the night and wants to know if Dr.Sonnenberg wants her to be seen sooner than the 10th she states she will keep this appt until she hears back from [place].

## 2022-02-21 ENCOUNTER — Encounter: Payer: Self-pay | Admitting: Family Medicine

## 2022-02-21 NOTE — Telephone Encounter (Signed)
If persistent cramps, needs to be evaluated.

## 2022-03-14 ENCOUNTER — Ambulatory Visit (INDEPENDENT_AMBULATORY_CARE_PROVIDER_SITE_OTHER): Payer: Federal, State, Local not specified - PPO | Admitting: Family Medicine

## 2022-03-14 ENCOUNTER — Ambulatory Visit: Payer: Federal, State, Local not specified - PPO | Admitting: Family Medicine

## 2022-03-14 ENCOUNTER — Encounter: Payer: Self-pay | Admitting: Family Medicine

## 2022-03-14 VITALS — BP 110/70 | HR 77 | Temp 98.4°F | Ht 62.0 in | Wt 191.2 lb

## 2022-03-14 DIAGNOSIS — M2550 Pain in unspecified joint: Secondary | ICD-10-CM | POA: Insufficient documentation

## 2022-03-14 DIAGNOSIS — I10 Essential (primary) hypertension: Secondary | ICD-10-CM | POA: Diagnosis not present

## 2022-03-14 DIAGNOSIS — R252 Cramp and spasm: Secondary | ICD-10-CM | POA: Insufficient documentation

## 2022-03-14 DIAGNOSIS — F419 Anxiety disorder, unspecified: Secondary | ICD-10-CM | POA: Diagnosis not present

## 2022-03-14 DIAGNOSIS — F32A Depression, unspecified: Secondary | ICD-10-CM

## 2022-03-14 LAB — COMPREHENSIVE METABOLIC PANEL
ALT: 17 U/L (ref 0–35)
AST: 17 U/L (ref 0–37)
Albumin: 4.1 g/dL (ref 3.5–5.2)
Alkaline Phosphatase: 79 U/L (ref 39–117)
BUN: 22 mg/dL (ref 6–23)
CO2: 32 mEq/L (ref 19–32)
Calcium: 9.1 mg/dL (ref 8.4–10.5)
Chloride: 105 mEq/L (ref 96–112)
Creatinine, Ser: 0.86 mg/dL (ref 0.40–1.20)
GFR: 72.77 mL/min (ref >60.00)
Glucose, Bld: 92 mg/dL (ref 70–99)
Potassium: 4.5 mEq/L (ref 3.5–5.1)
Sodium: 143 mEq/L (ref 135–145)
Total Bilirubin: 0.5 mg/dL (ref 0.2–1.2)
Total Protein: 6.4 g/dL (ref 6.0–8.3)

## 2022-03-14 LAB — MAGNESIUM: Magnesium: 2 mg/dL (ref 1.5–2.5)

## 2022-03-14 NOTE — Patient Instructions (Signed)
Nice to see you. Please make sure you are drinking about 2 L of water daily. Please start stretching. You could try taking in a teaspoon of yellow mustard nightly to see if that would prevent your cramps are recurring.

## 2022-03-14 NOTE — Assessment & Plan Note (Signed)
Concerning for underlying rheumatologic issue.  Will check lab work as outlined.  She can take meloxicam as needed for her pain.

## 2022-03-14 NOTE — Assessment & Plan Note (Signed)
Possibly related to inadequate hydration and inadequate stretching.  I encouraged her to stretch and increase her hydration status.  We will check electrolytes today.

## 2022-03-14 NOTE — Assessment & Plan Note (Addendum)
Chronic issue.  Adequately controlled on no medications.  She will continue to monitor.

## 2022-03-14 NOTE — Progress Notes (Signed)
Tammy Rumps, MD Phone: [phone]  Tammy Gregory Tammy Gregory is a 62 y.o. female who presents today for same-day visit.  Muscle cramps/arthralgias: Patient notes cramps in her hands and legs that have been going on for a month or so.  She was only drinking 1-2 bottles of water a day though she went to visit her family in [place] and just got back and was drinking 2 L a day while she was there and noted some improvement in her cramping.  She does not stretch.  She was exercising previously and having the cramps.  She notes arthralgias in most of her joints all over.  These been going on 1 to 2 months.  She takes meloxicam as needed.  She does have some leg swelling though that has improved some with stopping the amlodipine.  She notes occasionally having red spots on her legs.  She notes she has added magnesium and folic acid to see if that would help with her symptoms.  Anxiety/depression/sleep issues: Patient notes these things have improved.  She is taking 60 mg of duloxetine and has been on trazodone 50 mg nightly with good benefit for her sleep.  Hypertension: Patient has been off of amlodipine.  Social History   Tobacco Use  Smoking Status Never  Smokeless Tobacco Never    Current Outpatient Medications on File Prior to Visit  Medication Sig Dispense Refill   amLODipine (NORVASC) 5 MG tablet TAKE 1 TABLET BY MOUTH EVERY DAY 90 tablet 3   DULoxetine (CYMBALTA) 30 MG capsule Take 1 capsule (30 mg total) by mouth daily for 14 days, THEN 2 capsules (60 mg total) daily. 180 capsule 1   gabapentin (NEURONTIN) 300 MG capsule Take 1 capsule (300 mg total) by mouth at bedtime. 90 capsule 1   Melatonin 10 MG TABS Take 10 mg by mouth at bedtime.     meloxicam (MOBIC) 15 MG tablet Take 0.5 tablets (7.5 mg total) by mouth daily as needed for pain. 30 tablet 1   metFORMIN (GLUCOPHAGE-XR) 500 MG 24 hr tablet Take 500 mg by mouth every evening.     QSYMIA 7.5-46 MG CP24 Take 1 capsule by  mouth every morning.     traZODone (DESYREL) 50 MG tablet TAKE 0.5-1 TABLETS BY MOUTH AT BEDTIME AS NEEDED FOR SLEEP. 90 tablet 2   No current facility-administered medications on file prior to visit.     ROS see history of present illness  Objective  Physical Exam Vitals:   03/14/22 1025 03/14/22 1043  BP: 110/70   Pulse: 77   Temp: 98.4 F (36.9 C)   SpO2: 91% 95%    BP Readings from Last 3 Encounters:  03/14/22 110/70  12/16/21 120/78  10/20/21 124/76   Wt Readings from Last 3 Encounters:  03/14/22 191 lb 3.2 oz (86.7 kg)  12/16/21 185 lb (83.9 kg)  10/20/21 178 lb 12.8 oz (81.1 kg)    Physical Exam Constitutional:      General: She is not in acute distress.    Appearance: She is not diaphoretic.  Cardiovascular:     Rate and Rhythm: Normal rate and regular rhythm.     Heart sounds: Normal heart sounds.  Pulmonary:     Effort: Pulmonary effort is normal.     Breath sounds: Normal breath sounds.  Musculoskeletal:     Right lower leg: No edema.     Left lower leg: No edema.     Comments: Joint tenderness in her MCP joints bilaterally,  elbows, shoulders, and knees  Skin:    General: Skin is warm and dry.  Neurological:     Mental Status: She is alert.      Assessment/Plan: Please see individual problem list.  Primary hypertension Assessment & Plan: Chronic issue.  Adequately controlled on no medications.  She will continue to monitor.   Anxiety and depression Assessment & Plan: Chronic issue.  Improved.  She can continue duloxetine 60 mg daily and trazodone 50 mg nightly.   Muscle cramps Assessment & Plan: Possibly related to inadequate hydration and inadequate stretching.  I encouraged her to stretch and increase her hydration status.  We will check electrolytes today.  Orders: -     Comprehensive metabolic panel -     Magnesium  Arthralgia, unspecified joint Assessment & Plan: Concerning for underlying rheumatologic issue.  Will check lab  work as outlined.  She can take meloxicam as needed for her pain.  Orders: -     Comprehensive metabolic panel -     Cyclic citrul peptide antibody, IgG -     Rheumatoid factor -     ANA    Return in about 6 months (around 09/12/2022).   Tammy Rumps, MD [place]

## 2022-03-14 NOTE — Assessment & Plan Note (Addendum)
Chronic issue.  Improved.  She can continue duloxetine 60 mg daily and trazodone 50 mg nightly.

## 2022-03-15 ENCOUNTER — Ambulatory Visit: Payer: Federal, State, Local not specified - PPO | Admitting: Family Medicine

## 2022-03-16 LAB — ANA: Anti Nuclear Antibody (ANA): NEGATIVE

## 2022-03-16 LAB — CYCLIC CITRUL PEPTIDE ANTIBODY, IGG: Cyclic Citrullin Peptide Ab: <16 UNITS

## 2022-03-16 LAB — RHEUMATOID FACTOR: Rheumatoid fact SerPl-aCnc: <14 IU/mL (ref <14)

## 2022-04-07 DIAGNOSIS — Z114 Encounter for screening for human immunodeficiency virus [HIV]: Secondary | ICD-10-CM | POA: Diagnosis not present

## 2022-04-07 DIAGNOSIS — R200 Anesthesia of skin: Secondary | ICD-10-CM | POA: Diagnosis not present

## 2022-04-07 DIAGNOSIS — G5600 Carpal tunnel syndrome, unspecified upper limb: Secondary | ICD-10-CM | POA: Diagnosis not present

## 2022-04-07 DIAGNOSIS — F411 Generalized anxiety disorder: Secondary | ICD-10-CM | POA: Diagnosis not present

## 2022-04-07 DIAGNOSIS — R413 Other amnesia: Secondary | ICD-10-CM | POA: Diagnosis not present

## 2022-04-07 DIAGNOSIS — R202 Paresthesia of skin: Secondary | ICD-10-CM | POA: Diagnosis not present

## 2022-04-11 ENCOUNTER — Other Ambulatory Visit: Payer: Self-pay | Admitting: Physician Assistant

## 2022-04-11 DIAGNOSIS — R413 Other amnesia: Secondary | ICD-10-CM

## 2022-04-16 ENCOUNTER — Emergency Department: Payer: Federal, State, Local not specified - PPO

## 2022-04-16 ENCOUNTER — Emergency Department
Admission: EM | Admit: 2022-04-16 | Discharge: 2022-04-16 | Disposition: A | Discharge status: Home / Self Care | Payer: Federal, State, Local not specified - PPO | Attending: Emergency Medicine | Admitting: Emergency Medicine

## 2022-04-16 ENCOUNTER — Other Ambulatory Visit: Payer: Self-pay

## 2022-04-16 DIAGNOSIS — R1032 Left lower quadrant pain: Secondary | ICD-10-CM | POA: Diagnosis not present

## 2022-04-16 DIAGNOSIS — N132 Hydronephrosis with renal and ureteral calculous obstruction: Secondary | ICD-10-CM | POA: Diagnosis not present

## 2022-04-16 DIAGNOSIS — I10 Essential (primary) hypertension: Secondary | ICD-10-CM | POA: Insufficient documentation

## 2022-04-16 DIAGNOSIS — R109 Unspecified abdominal pain: Secondary | ICD-10-CM

## 2022-04-16 DIAGNOSIS — N200 Calculus of kidney: Secondary | ICD-10-CM | POA: Diagnosis not present

## 2022-04-16 DIAGNOSIS — K5730 Diverticulosis of large intestine without perforation or abscess without bleeding: Secondary | ICD-10-CM | POA: Diagnosis not present

## 2022-04-16 DIAGNOSIS — N049 Nephrotic syndrome with unspecified morphologic changes: Secondary | ICD-10-CM | POA: Diagnosis not present

## 2022-04-16 DIAGNOSIS — K769 Liver disease, unspecified: Secondary | ICD-10-CM | POA: Diagnosis not present

## 2022-04-16 LAB — BASIC METABOLIC PANEL
Anion gap: 7 (ref 5–15)
BUN: 23 mg/dL (ref 8–23)
CO2: 27 mmol/L (ref 22–32)
Calcium: 9.1 mg/dL (ref 8.9–10.3)
Chloride: 106 mmol/L (ref 98–111)
Creatinine, Ser: 1.11 mg/dL — ABNORMAL HIGH (ref 0.44–1.00)
GFR, Estimated: 57 mL/min — ABNORMAL LOW (ref >60)
Glucose, Bld: 112 mg/dL — ABNORMAL HIGH (ref 70–99)
Potassium: 3.1 mmol/L — ABNORMAL LOW (ref 3.5–5.1)
Sodium: 140 mmol/L (ref 135–145)

## 2022-04-16 LAB — URINALYSIS, ROUTINE W REFLEX MICROSCOPIC
Bilirubin Urine: NEGATIVE
Glucose, UA: NEGATIVE mg/dL
Ketones, ur: 5 mg/dL — AB
Nitrite: NEGATIVE
Protein, ur: NEGATIVE mg/dL
Specific Gravity, Urine: 1.014 (ref 1.005–1.030)
pH: 5 (ref 5.0–8.0)

## 2022-04-16 LAB — CBC
HCT: 41.1 % (ref 36.0–46.0)
Hemoglobin: 13.1 g/dL (ref 12.0–15.0)
MCH: 29.8 pg (ref 26.0–34.0)
MCHC: 31.9 g/dL (ref 30.0–36.0)
MCV: 93.6 fL (ref 80.0–100.0)
Platelets: 201 10*3/uL (ref 150–400)
RBC: 4.39 MIL/uL (ref 3.87–5.11)
RDW: 13.7 % (ref 11.5–15.5)
WBC: 8.1 10*3/uL (ref 4.0–10.5)
nRBC: 0 % (ref 0.0–0.2)

## 2022-04-16 MED ORDER — TAMSULOSIN HCL 0.4 MG PO CAPS: 0.4000 mg | ORAL_CAPSULE | Freq: Every day | ORAL | 0 refills | Status: AC

## 2022-04-16 MED ORDER — MORPHINE SULFATE (PF) 4 MG/ML IV SOLN
4.0000 mg | Freq: Once | INTRAVENOUS | Status: AC
  Administered 2022-04-16: 4 mg via INTRAVENOUS
  Filled 2022-04-16: qty 1

## 2022-04-16 MED ORDER — TAMSULOSIN HCL 0.4 MG PO CAPS
0.4000 mg | ORAL_CAPSULE | Freq: Once | ORAL | Status: AC
  Administered 2022-04-16: 0.4 mg via ORAL
  Filled 2022-04-16: qty 1

## 2022-04-16 MED ORDER — ONDANSETRON 8 MG PO TBDP: 8.0000 mg | ORAL_TABLET | Freq: Three times a day (TID) | ORAL | 0 refills | Status: DC | PRN

## 2022-04-16 MED ORDER — KETOROLAC TROMETHAMINE 10 MG PO TABS: 10.0000 mg | ORAL_TABLET | Freq: Four times a day (QID) | ORAL | 0 refills | Status: DC | PRN

## 2022-04-16 MED ORDER — SODIUM CHLORIDE 0.9 % IV BOLUS
1000.0000 mL | Freq: Once | INTRAVENOUS | Status: AC
  Administered 2022-04-16: 1000 mL via INTRAVENOUS

## 2022-04-16 MED ORDER — KETOROLAC TROMETHAMINE 30 MG/ML IJ SOLN
30.0000 mg | Freq: Once | INTRAMUSCULAR | Status: AC
  Administered 2022-04-16: 30 mg via INTRAVENOUS
  Filled 2022-04-16: qty 1

## 2022-04-16 MED ORDER — ONDANSETRON HCL 4 MG/2ML IJ SOLN
4.0000 mg | Freq: Once | INTRAMUSCULAR | Status: AC
  Administered 2022-04-16: 4 mg via INTRAVENOUS
  Filled 2022-04-16: qty 2

## 2022-04-16 MED ORDER — HYDROCODONE-ACETAMINOPHEN 5-325 MG PO TABS: 1.0000 | ORAL_TABLET | ORAL | 0 refills | Status: DC | PRN

## 2022-04-16 NOTE — ED Triage Notes (Signed)
Pt arrives via POV with CC of L sided flank "soreness" that has been ongoing for the last week. Sharp pain woke pt out of sleep tonight with associated nausea without vomiting. Denies diarrhea and difficulty urinating. Pt is alert and oriented. Appears to be somewhat uncomfortable but able to ambulate without assistance.

## 2022-04-16 NOTE — ED Provider Notes (Signed)
[facility] Provider Note   Event Date/Time   First MD Initiated Contact with Patient 04/16/22 [phone]     (approximate) History  Flank Pain  HPI Tammy Gregory is a 62 y.o. female with a stated past medical history of hypertension who presents for left-sided flank pain that has been dull and nonradiating over the last 24 hours.  Patient states that this pain acutely worsened over the past few hours to a 9/10, left flank pain that radiates down into her left lower quadrant.  Patient endorses associated dysuria.  Upon arrival, patient did have 1 episode of nausea/vomiting ROS: Patient currently denies any vision changes, tinnitus, difficulty speaking, facial droop, sore throat, chest pain, shortness of breath, diarrhea, or weakness/numbness/paresthesias in any extremity   Physical Exam  Triage Vital Signs: ED Triage Vitals  Enc Vitals Group     BP 04/16/22 0523 139/70     Pulse Rate 04/16/22 0523 67     Resp 04/16/22 0523 19     Temp 04/16/22 0523 97.8 F (36.6 C)     Temp Source 04/16/22 0523 Oral     SpO2 04/16/22 0523 98 %     Weight 04/16/22 0526 184 lb (83.5 kg)     Height 04/16/22 0526 5' 2"$  (1.575 m)     Head Circumference --      Peak Flow --      Pain Score 04/16/22 0525 9     Pain Loc --      Pain Edu? --      Excl. in [place]? --    Most recent vital signs: Vitals:   04/16/22 0523 04/16/22 0700  BP: 139/70   Pulse: 67 76  Resp: 19 20  Temp: 97.8 F (36.6 C)   SpO2: 98% 96%   General: Awake, oriented x4. CV:  Good peripheral perfusion.  Resp:  Normal effort.  Abd:  No distention.  Other:  Middle-aged overweight Hispanic female laying in bed in no acute distress ED Results / Procedures / Treatments  Labs (all labs ordered are listed, but only abnormal results are displayed) Labs Reviewed  URINALYSIS, ROUTINE W REFLEX MICROSCOPIC - Abnormal; Notable for the following components:      Result Value   Color, Urine YELLOW (*)     APPearance HAZY (*)    Hgb urine dipstick MODERATE (*)    Ketones, ur 5 (*)    Leukocytes,Ua SMALL (*)    Bacteria, UA RARE (*)    All other components within normal limits  BASIC METABOLIC PANEL - Abnormal; Notable for the following components:   Potassium 3.1 (*)    Glucose, Bld 112 (*)    Creatinine, Ser 1.11 (*)    GFR, Estimated 57 (*)    All other components within normal limits  CBC   RADIOLOGY ED MD interpretation: CT of the abdomen and pelvis without IV contrast and renal stone protocol interpreted independently by me and shows a 2 x 5 x 6 mm distal left ureteral stone with mild left hydroureteronephrosis and perinephric and periureteral edema -Agree with radiology assessment Official radiology report(s): CT Renal Stone Study  Result Date: 04/16/2022 CLINICAL DATA:  Left-sided flank pain and soreness. EXAM: CT ABDOMEN AND PELVIS WITHOUT CONTRAST TECHNIQUE: Multidetector CT imaging of the abdomen and pelvis was performed following the standard protocol without IV contrast. RADIATION DOSE REDUCTION: This exam was performed according to the departmental dose-optimization program which includes automated exposure control, adjustment of the mA and/or  kV according to patient size and/or use of iterative reconstruction technique. COMPARISON:  03/02/2016 FINDINGS: Lower chest: Unremarkable. Hepatobiliary: 8 mm ill-defined hypo density in the posterior right liver is stable since prior study consistent with benign etiology. No followup imaging is recommended. Similar 6 mm hypodensity in the medial segment left liver is also unchanged. No followup imaging is recommended. There is no evidence for gallstones, gallbladder wall thickening, or pericholecystic fluid. No intrahepatic or extrahepatic biliary dilation. Pancreas: No focal mass lesion. No dilatation of the main duct. No intraparenchymal cyst. No peripancreatic edema. Spleen: No splenomegaly. No focal mass lesion. Adrenals/Urinary Tract: No  adrenal nodule or mass. Right kidney and ureter unremarkable. There is mild left hydroureteronephrosis with perinephric and periureteric edema. Left ureter is dilated down to the level of the low pelvis where a 2 x 5 x 6 mm stone is identified in the distal left ureter about 2.5 cm above the UVJ. The urinary bladder appears normal for the degree of distention. Stomach/Bowel: Stomach is unremarkable. No gastric wall thickening. No evidence of outlet obstruction. Duodenum is normally positioned as is the ligament of Treitz. No small bowel wall thickening. No small bowel dilatation. The terminal ileum is normal. The appendix is normal. No gross colonic mass. No colonic wall thickening. Diverticular changes are noted in the left colon without evidence of diverticulitis. Vascular/Lymphatic: There is mild atherosclerotic calcification of the abdominal aorta without aneurysm. There is no gastrohepatic or hepatoduodenal ligament lymphadenopathy. No retroperitoneal or mesenteric lymphadenopathy. No pelvic sidewall lymphadenopathy. Reproductive: Unremarkable. Other: No intraperitoneal free fluid. Musculoskeletal: No worrisome lytic or sclerotic osseous abnormality. IMPRESSION: 1. 2 x 5 x 6 mm distal left ureteral stone with mild left hydroureteronephrosis and perinephric and periureteric edema. No other urinary stone disease evident. 2. No other acute findings in the abdomen or pelvis. 3. Left colonic diverticulosis without diverticulitis. 4.  Aortic Atherosclerosis (ICD10-I70.0). Electronically Signed   By: Misty Stanley M.D.   On: 04/16/2022 06:38   PROCEDURES: Critical Care performed: No .1-3 Lead EKG Interpretation  Performed by: Naaman Plummer, MD Authorized by: Naaman Plummer, MD     Interpretation: normal     ECG rate:  71   ECG rate assessment: normal     Rhythm: sinus rhythm     Ectopy: none     Conduction: normal    MEDICATIONS ORDERED IN ED: Medications  sodium chloride 0.9 % bolus 1,000 mL (0 mLs  Intravenous Stopped 04/16/22 0723)  ondansetron (ZOFRAN) injection 4 mg (4 mg Intravenous Given 04/16/22 0612)  morphine (PF) 4 MG/ML injection 4 mg (4 mg Intravenous Given 04/16/22 0612)  ketorolac (TORADOL) 30 MG/ML injection 30 mg (30 mg Intravenous Given 04/16/22 0612)  tamsulosin (FLOMAX) capsule 0.4 mg (0.4 mg Oral Given 04/16/22 0718)   IMPRESSION / MDM / ASSESSMENT AND PLAN / ED COURSE  I reviewed the triage vital signs and the nursing notes.                             The patient is on the cardiac monitor to evaluate for evidence of arrhythmia and/or significant heart rate changes. Patient's presentation is most consistent with acute presentation with potential threat to life or bodily function. Patient presents for severe flank pain. Presentation most consistent with Renal Colic from a Non-infected Kidney Stone. Given History and Exam I have lower suspicion for atypical appendicitis, genital torsion, acute cholecystitis, AAA, Aortic Dissection, Serious Bacterial Illness or other  emergent intraabdominal pathology.  Workup: CBC, BMP, CT Abd/Pelvis noncontrast, UA, reassess Findings: 2 x 5 x 6 mm kidney stone at the left UVJ Reassesment: Patient tolerating PO and pain controlled Disposition:  Discharge. Strict return precautions for infected stone or PO intolerance discussed.   FINAL CLINICAL IMPRESSION(S) / ED DIAGNOSES   Final diagnoses:  Left flank pain  Kidney stone   Rx / DC Orders   ED Discharge Orders          Ordered    HYDROcodone-acetaminophen (NORCO) 5-325 MG tablet  Every 4 hours PRN        04/16/22 0654    ketorolac (TORADOL) 10 MG tablet  Every 6 hours PRN       Note to Pharmacy: Patient given an IM/IV loading dose in emergency department   04/16/22 0654    tamsulosin (FLOMAX) 0.4 MG CAPS capsule  Daily        04/16/22 0654    ondansetron (ZOFRAN-ODT) 8 MG disintegrating tablet  Every 8 hours PRN        04/16/22 0654           Note:  This document was  prepared using Dragon voice recognition software and may include unintentional dictation errors.   Naaman Plummer, MD 04/16/22 [phone]

## 2022-04-18 ENCOUNTER — Telehealth: Payer: Self-pay

## 2022-04-18 NOTE — Transitions of Care (Post Inpatient/ED Visit) (Signed)
   04/18/2022  Name: Patience Nuzzo MRN: [id number] DOB: Jun 06, 1960  Today's TOC FU Call Status: Today's TOC FU Call Status:: Unsuccessul Call (1st Attempt) Unsuccessful Call (1st Attempt) Date: 04/18/22  Attempted to reach the patient regarding the most recent Inpatient/ED visit.  Follow Up Plan: Additional outreach attempts will be made to reach the patient to complete the Transitions of Care (Post Inpatient/ED visit) call.   Signature Ferne Reus, RN

## 2022-04-19 NOTE — Transitions of Care (Post Inpatient/ED Visit) (Signed)
   04/19/2022  Name: Tammy Gregory MRN: [id number] DOB: 06/07/1960  Today's TOC FU Call Status: Today's TOC FU Call Status:: Successful TOC FU Call Competed Unsuccessful Call (1st Attempt) Date: 04/18/22 [facility] FU Call Complete Date: 04/19/22  Transition Care Management Follow-up Telephone Call Date of Discharge: 04/16/22 Discharge Facility: [facility] Type of Discharge: Emergency Department Reason for ED Visit: Renal Renal Diagnosis:  (Kidney Stone) How have you been since you were released from the hospital?: Better Any questions or concerns?: No  Items Reviewed: Did you receive and understand the discharge instructions provided?: Yes Medications obtained and verified?: Yes (Medications Reviewed) Any new allergies since your discharge?: No Dietary orders reviewed?: NA Do you have support at home?: Yes People in Home: alone  Home Care and Equipment/Supplies: [place] Ordered?: No Any new equipment or medical supplies ordered?: NA  Functional Questionnaire: Do you need assistance with bathing/showering or dressing?: Yes Do you need assistance with meal preparation?: Yes Do you need assistance with eating?: Yes Do you have difficulty maintaining continence: Yes Do you need assistance with getting out of bed/getting out of a chair/moving?: Yes Do you have difficulty managing or taking your medications?: Yes  Folllow up appointments reviewed: PCP Follow-up appointment confirmed?: No (Pt declined follow up with PCP) MD Provider Line Number:[phone] Given: No [facility] Follow-up appointment confirmed?: NA (Pt has reachout to Urology to see if follow up is needed.) Reason Specialist Follow-Up Not Confirmed: Patient has Specialist Provider Number and will Call for Appointment Do you need transportation to your follow-up appointment?: No Do you understand care options if your condition(s) worsen?: Yes-patient verbalized understanding    SIGNATURE  Ferne Reus, RN

## 2022-04-19 NOTE — Telephone Encounter (Signed)
Reviewed

## 2022-04-21 ENCOUNTER — Ambulatory Visit
Admission: RE | Admit: 2022-04-21 | Discharge: 2022-04-21 | Disposition: A | Discharge status: Home / Self Care | Payer: Federal, State, Local not specified - PPO | Source: Ambulatory Visit | Attending: Physician Assistant | Admitting: Physician Assistant

## 2022-04-21 DIAGNOSIS — R413 Other amnesia: Secondary | ICD-10-CM | POA: Diagnosis not present

## 2022-04-21 DIAGNOSIS — R519 Headache, unspecified: Secondary | ICD-10-CM | POA: Diagnosis not present

## 2022-04-26 ENCOUNTER — Other Ambulatory Visit
Admission: RE | Admit: 2022-04-26 | Discharge: 2022-04-26 | Disposition: A | Discharge status: Home / Self Care | Payer: Federal, State, Local not specified - PPO | Source: Home / Self Care | Attending: Urology | Admitting: Urology

## 2022-04-26 ENCOUNTER — Ambulatory Visit
Admission: RE | Admit: 2022-04-26 | Discharge: 2022-04-26 | Disposition: A | Discharge status: Home / Self Care | Payer: Federal, State, Local not specified - PPO | Attending: Urology | Admitting: Urology

## 2022-04-26 ENCOUNTER — Encounter: Payer: Self-pay | Admitting: Urology

## 2022-04-26 ENCOUNTER — Other Ambulatory Visit: Payer: Self-pay

## 2022-04-26 ENCOUNTER — Ambulatory Visit
Admission: RE | Admit: 2022-04-26 | Discharge: 2022-04-26 | Disposition: A | Discharge status: Home / Self Care | Payer: Federal, State, Local not specified - PPO | Source: Ambulatory Visit | Attending: Urology | Admitting: Urology

## 2022-04-26 ENCOUNTER — Ambulatory Visit (INDEPENDENT_AMBULATORY_CARE_PROVIDER_SITE_OTHER): Payer: Federal, State, Local not specified - PPO | Admitting: Urology

## 2022-04-26 VITALS — BP 174/93 | HR 83

## 2022-04-26 DIAGNOSIS — N201 Calculus of ureter: Secondary | ICD-10-CM

## 2022-04-26 DIAGNOSIS — N200 Calculus of kidney: Secondary | ICD-10-CM

## 2022-04-26 LAB — URINALYSIS, COMPLETE (UACMP) WITH MICROSCOPIC
Bilirubin Urine: NEGATIVE
Glucose, UA: NEGATIVE mg/dL
Ketones, ur: NEGATIVE mg/dL
Leukocytes,Ua: NEGATIVE
Nitrite: NEGATIVE
Protein, ur: NEGATIVE mg/dL
Specific Gravity, Urine: 1.02 (ref 1.005–1.030)
pH: 5.5 (ref 5.0–8.0)

## 2022-04-26 MED ORDER — TAMSULOSIN HCL 0.4 MG PO CAPS: 0.4000 mg | ORAL_CAPSULE | Freq: Every day | ORAL | 0 refills | Status: DC

## 2022-04-26 MED ORDER — HYDROCODONE-ACETAMINOPHEN 5-325 MG PO TABS: 1.0000 | ORAL_TABLET | Freq: Four times a day (QID) | ORAL | 0 refills | Status: DC | PRN

## 2022-04-26 NOTE — Progress Notes (Signed)
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  CT showed a 6 mm left distal ureteral stone with hydronephrosis.  Urinalysis was noninfected and she was discharged with medical expulsive therapy.  She denies any prior stone episodes.  She has had intermittent renal colic, but no pain over the last 24 hours.  She has been using Toradol and Norco intermittently for pain.  She denies any gross hematuria, dysuria, urgency/frequency, no fevers or chills.  Urinalysis today completely benign   PMH: Past Medical History:  Diagnosis Date   Anxiety    Chicken pox    Headache    Hypertension     Surgical History: Past Surgical History:  Procedure Laterality Date   ANTERIOR CERVICAL DECOMP/DISCECTOMY FUSION N/A 06/09/2021   Procedure: Anterior Cervical Discectomy Fusion - Cervical seven-Thoracic one;  Surgeon: Earnie Larsson, MD;  Location: [facility];  Service: Neurosurgery;  Laterality: N/A;   CERVICAL FUSION  12/03/2014   CESAREAN SECTION     3   COLONOSCOPY WITH PROPOFOL N/A 04/02/2015   Procedure: COLONOSCOPY WITH PROPOFOL;  Surgeon: Lollie Sails, MD;  Location: [facility] ENDOSCOPY;  Service: Endoscopy;  Laterality: N/A;   TUBAL LIGATION     Family History: Family History  Problem Relation Age of Onset   Hypertension Father    Hypertension Mother    Breast cancer Cousin        2 mat cousins    Social History:  reports that she has never smoked. She has never been exposed to tobacco smoke. She has never used smokeless tobacco. She reports current alcohol use. She reports that she does not use drugs.  Physical Exam: BP (!) 174/93   Pulse 83   LMP 04/02/2007 (Approximate)    Constitutional:  Alert and oriented, No acute distress. Cardiovascular: No clubbing, cyanosis, or edema. Respiratory: Normal respiratory effort, no  increased work of breathing. GI: Abdomen is soft, nontender, nondistended, no abdominal masses   Laboratory Data: See HPI  Pertinent Imaging: I have personally viewed and interpreted the CT scan dated 04/16/2022 showing a 6 mm left distal ureteral stone with upstream hydronephrosis.  Assessment & Plan:   63 year old female who presented to the ER on 04/16/2022 with severe left-sided flank pain and CT showed a 6 mm left distal ureteral stone.  Pain has resolved in the last 24 hours but she has not seen a stone pass.  Urinalysis today is benign.  We discussed various treatment options for urolithiasis including observation with or without medical expulsive therapy, shockwave lithotripsy (SWL), ureteroscopy and laser lithotripsy with stent placement, and percutaneous nephrolithotomy.  We discussed that management is based on stone size, location, density, patient co-morbidities, and patient preference.   Stones <63m in size have a >80% spontaneous passage rate. Data surrounding the use of tamsulosin for medical expulsive therapy is controversial, but meta analyses suggests it is most efficacious for distal stones between 5-134min size. Possible side effects include dizziness/lightheadedness, and retrograde ejaculation.SWL has a lower stone free rate in a single procedure, but also a lower complication rate compared to ureteroscopy and avoids a stent and associated stent related symptoms. Possible complications include renal hematoma, steinstrasse, and need for additional treatment.Ureteroscopy with laser lithotripsy and stent placement has a higher stone free rate than SWL in a single procedure, however increased complication rate  including possible infection, ureteral injury, bleeding, and stent related morbidity. Common stent related symptoms include dysuria, urgency/frequency, and flank pain.  -Using shared decision making, she opted for a KUB today to evaluate if stone is still present.  Will call  with results.  If stone is still present, she would like to be scheduled next week for left ureteroscopy, laser lithotripsy, stent placement. -Flomax and pain meds refilled   Nickolas Madrid, MD 04/26/2022  [facility] [address] [place], [place] [address] [phone]

## 2022-04-26 NOTE — H&P (View-Only) (Signed)
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  CT showed a 6 mm left distal ureteral stone with hydronephrosis.  Urinalysis was noninfected and she was discharged with medical expulsive therapy.  She denies any prior stone episodes.  She has had intermittent renal colic, but no pain over the last 24 hours.  She has been using Toradol and Norco intermittently for pain.  She denies any gross hematuria, dysuria, urgency/frequency, no fevers or chills.  Urinalysis today completely benign   PMH: Past Medical History:  Diagnosis Date   Anxiety    Chicken pox    Headache    Hypertension     Surgical History: Past Surgical History:  Procedure Laterality Date   ANTERIOR CERVICAL DECOMP/DISCECTOMY FUSION N/A 06/09/2021   Procedure: Anterior Cervical Discectomy Fusion - Cervical seven-Thoracic one;  Surgeon: Earnie Larsson, MD;  Location: [facility];  Service: Neurosurgery;  Laterality: N/A;   CERVICAL FUSION  12/03/2014   CESAREAN SECTION     3   COLONOSCOPY WITH PROPOFOL N/A 04/02/2015   Procedure: COLONOSCOPY WITH PROPOFOL;  Surgeon: Lollie Sails, MD;  Location: [facility] ENDOSCOPY;  Service: Endoscopy;  Laterality: N/A;   TUBAL LIGATION     Family History: Family History  Problem Relation Age of Onset   Hypertension Father    Hypertension Mother    Breast cancer Cousin        2 mat cousins    Social History:  reports that she has never smoked. She has never been exposed to tobacco smoke. She has never used smokeless tobacco. She reports current alcohol use. She reports that she does not use drugs.  Physical Exam: BP (!) 174/93   Pulse 83   LMP 04/02/2007 (Approximate)    Constitutional:  Alert and oriented, No acute distress. Cardiovascular: No clubbing, cyanosis, or edema. Respiratory: Normal respiratory effort, no  increased work of breathing. GI: Abdomen is soft, nontender, nondistended, no abdominal masses   Laboratory Data: See HPI  Pertinent Imaging: I have personally viewed and interpreted the CT scan dated 04/16/2022 showing a 6 mm left distal ureteral stone with upstream hydronephrosis.  Assessment & Plan:   62 year old female who presented to the ER on 04/16/2022 with severe left-sided flank pain and CT showed a 6 mm left distal ureteral stone.  Pain has resolved in the last 24 hours but she has not seen a stone pass.  Urinalysis today is benign.  We discussed various treatment options for urolithiasis including observation with or without medical expulsive therapy, shockwave lithotripsy (SWL), ureteroscopy and laser lithotripsy with stent placement, and percutaneous nephrolithotomy.  We discussed that management is based on stone size, location, density, patient co-morbidities, and patient preference.   Stones <51m in size have a >80% spontaneous passage rate. Data surrounding the use of tamsulosin for medical expulsive therapy is controversial, but meta analyses suggests it is most efficacious for distal stones between 5-175min size. Possible side effects include dizziness/lightheadedness, and retrograde ejaculation.SWL has a lower stone free rate in a single procedure, but also a lower complication rate compared to ureteroscopy and avoids a stent and associated stent related symptoms. Possible complications include renal hematoma, steinstrasse, and need for additional treatment.Ureteroscopy with laser lithotripsy and stent placement has a higher stone free rate than SWL in a single procedure, however increased complication rate  including possible infection, ureteral injury, bleeding, and stent related morbidity. Common stent related symptoms include dysuria, urgency/frequency, and flank pain.  -Using shared decision making, she opted for a KUB today to evaluate if stone is still present.  Will call  with results.  If stone is still present, she would like to be scheduled next week for left ureteroscopy, laser lithotripsy, stent placement. -Flomax and pain meds refilled   Nickolas Madrid, MD 04/26/2022  [facility] [address], [place], [place] [address] [phone]

## 2022-04-26 NOTE — Patient Instructions (Signed)
Laser Therapy for Kidney Stones Laser therapy for kidney stones is a procedure to break up small, hard mineral deposits that form in the kidney (kidney stones). The procedure is done using a device that produces a focused beam of light (laser). The laser breaks up kidney stones into pieces that are small enough to be passed out of the body through urination or removed from the body during the procedure. You may need laser therapy if you have kidney stones that are painful or block your urinary tract. This procedure is done by inserting a tube (ureteroscope) into your kidney through the urethral opening. The urethra is the part of the body that drains urine from the bladder. In women, the urethra opens above the vaginal opening. In men, the urethra opens at the tip of the penis. The ureteroscope is inserted through the urethra, and surgical instruments are moved through the bladder and the muscular tube that connects the kidney to the bladder (ureter) until they reach the kidney. Tell a health care provider about: Any allergies you have. All medicines you are taking, including vitamins, herbs, eye drops, creams, and over-the-counter medicines. Any problems you or family members have had with anesthetic medicines. Any blood disorders you have. Any surgeries you have had. Any medical conditions you have. Whether you are pregnant or may be pregnant. What are the risks? Generally, this is a safe procedure. However, problems may occur, including: Infection. Bleeding. Allergic reactions to medicines. Damage to the urethra, bladder, or ureter. Urinary tract infection (UTI). Narrowing of the urethra (urethral stricture). Difficulty passing urine. Blockage of the kidney caused by a fragment of kidney stone. What happens before the procedure? Medicines Ask your health care provider about: Changing or stopping your regular medicines. This is especially important if you are taking diabetes medicines or  blood thinners. Taking medicines such as aspirin and ibuprofen. These medicines can thin your blood. Do not take these medicines unless your health care provider tells you to take them. Taking over-the-counter medicines, vitamins, herbs, and supplements. Eating and drinking Follow instructions from your health care provider about eating and drinking, which may include: 8 hours before the procedure - stop eating heavy meals or foods, such as meat, fried foods, or fatty foods. 6 hours before the procedure - stop eating light meals or foods, such as toast or cereal. 6 hours before the procedure - stop drinking milk or drinks that contain milk. 2 hours before the procedure - stop drinking clear liquids. Staying hydrated Follow instructions from your health care provider about hydration, which may include: Up to 2 hours before the procedure - you may continue to drink clear liquids, such as water, clear fruit juice, black coffee, and plain tea.  General instructions You may have a physical exam before the procedure. You may also have tests, such as imaging tests and blood or urine tests. If your ureter is too narrow, your health care provider may place a soft, flexible tube (stent) inside of it. The stent may be placed days or weeks before your laser therapy procedure. Plan to have someone take you home from the hospital or clinic. If you will be going home right after the procedure, plan to have someone stay with you for 24 hours. Do not use any products that contain nicotine or tobacco for at least 4 weeks before the procedure. These products include cigarettes, e-cigarettes, and chewing tobacco. If you need help quitting, ask your health care provider. Ask your health care provider: How your   surgical site will be marked or identified. What steps will be taken to help prevent infection. These may include: Removing hair at the surgery site. Washing skin with a germ-killing soap. Taking antibiotic  medicine. What happens during the procedure?  An IV will be inserted into one of your veins. You will be given one or more of the following: A medicine to help you relax (sedative). A medicine to numb the area (local anesthetic). A medicine to make you fall asleep (general anesthetic). A ureteroscope will be inserted into your urethra. The ureteroscope will send images to a video screen in the operating room to guide your surgeon to the area of your kidney that will be treated. A small, flexible tube will be threaded through the ureteroscope and into your bladder and ureter, up to your kidney. The laser device will be inserted into your kidney through the tube. Your surgeon will pulse the laser on and off to break up kidney stones. A surgical instrument that has a tiny wire basket may be inserted through the tube into your kidney to remove the pieces of broken kidney stone. The procedure may vary among health care providers and hospitals. What happens after the procedure? Your blood pressure, heart rate, breathing rate, and blood oxygen level will be monitored until you leave the hospital or clinic. You will be given pain medicine as needed. You may continue to receive antibiotics. You may have a stent temporarily placed in your ureter. Do not drive for 24 hours if you were given a sedative during your procedure. You may be given a strainer to collect any stone fragments that you pass in your urine. Your health care provider may have these tested. This information is not intended to replace advice given to you by your health care provider. Make sure you discuss any questions you have with your health care provider. Document Revised: 06/29/2021 Document Reviewed: 10/25/2020 Elsevier Patient Education  [place].

## 2022-04-27 ENCOUNTER — Other Ambulatory Visit: Payer: Self-pay | Admitting: Urology

## 2022-04-27 ENCOUNTER — Other Ambulatory Visit: Payer: Self-pay | Admitting: Family Medicine

## 2022-04-27 DIAGNOSIS — N201 Calculus of ureter: Secondary | ICD-10-CM

## 2022-04-27 DIAGNOSIS — F32A Depression, unspecified: Secondary | ICD-10-CM

## 2022-04-27 NOTE — Progress Notes (Signed)
Surgical Physician Order Form [facility]   * Scheduling expectation :  Friday 3/1  *Length of Cas: 30 minutes  *Clearance needed: no  *Anticoagulation Instructions: May continue all anticoagulants  *Aspirin Instructions: Ok to continue all  *Post-op visit Date/Instructions:   tbd  *Diagnosis: Left Ureteral Stone  *Procedure: left Ureteroscopy w/laser lithotripsy & stent placement [id number])   Additional orders: N/A  -Admit type: OUTpatient  -Anesthesia: General  -VTE Prophylaxis Standing Order SCD's       Other:   -Standing Lab Orders Per Anesthesia    Lab other: None  -Standing Test orders EKG/Chest x-ray per Anesthesia       Test other:   - Medications:  Cipro '400mg'$  IV  -Other orders:  N/A

## 2022-05-01 ENCOUNTER — Telehealth: Payer: Self-pay

## 2022-05-01 NOTE — Progress Notes (Signed)
   [facility] From  Surgery Date: Date: 05/05/2022  Surgeon: Dr. Nickolas Madrid, MD  Inpt ( No  )   Outpt (Yes)   Obs ( No  )   Diagnosis: N20.1 Left Ureteral Stone  -CPT: [phone]  Surgery: Left Ureteroscopy with Laser Lithotripsy and Stent Placement   Stop Anticoagulations: No, May continue all  Cardiac/Medical/Pulmonary Clearance needed: no  *Orders entered into EPIC  Date: 05/01/22   *Case booked in [place]  Date: 05/01/22  *Notified pt of Surgery: Date: 05/01/22  PRE-OP UA & CX: no  *Placed into Prior Authorization Work [place] Date: 05/01/22  Assistant/laser/rep:No

## 2022-05-01 NOTE — Telephone Encounter (Signed)
I spoke with Mrs. Tammy Gregory. We have discussed possible surgery dates and Friday March 1st, 2024 was agreed upon by all parties. Patient given information about surgery date, what to expect pre-operatively and post operatively.  We discussed that a Pre-Admission Testing office will be calling to set up the pre-op visit that will take place prior to surgery, and that these appointments are typically done over the phone with a Pre-Admissions RN. Informed patient that our office will communicate any additional care to be provided after surgery. Patients questions or concerns were discussed during our call. Advised to call our office should there be any additional information, questions or concerns that arise. Patient verbalized understanding.

## 2022-05-04 ENCOUNTER — Encounter
Admission: RE | Admit: 2022-05-04 | Discharge: 2022-05-04 | Disposition: A | Discharge status: Home / Self Care | Payer: Federal, State, Local not specified - PPO | Source: Ambulatory Visit | Attending: Urology | Admitting: Urology

## 2022-05-04 VITALS — Ht 62.0 in | Wt 184.1 lb

## 2022-05-04 DIAGNOSIS — Z01818 Encounter for other preprocedural examination: Secondary | ICD-10-CM

## 2022-05-04 HISTORY — DX: Depression, unspecified: F32.A

## 2022-05-04 HISTORY — DX: Fatty (change of) liver, not elsewhere classified: K76.0

## 2022-05-04 HISTORY — DX: Pure hypercholesterolemia, unspecified: E78.00

## 2022-05-04 HISTORY — DX: Personal history of urinary calculi: Z87.442

## 2022-05-04 HISTORY — DX: Insomnia, unspecified: G47.00

## 2022-05-04 HISTORY — DX: Gastro-esophageal reflux disease without esophagitis: K21.9

## 2022-05-04 HISTORY — DX: Obesity, unspecified: E66.9

## 2022-05-04 MED ORDER — CHLORHEXIDINE GLUCONATE 0.12 % MT SOLN: 15.0000 mL | Freq: Once | OROMUCOSAL | Status: AC

## 2022-05-04 MED ORDER — CIPROFLOXACIN IN D5W 400 MG/200ML IV SOLN
400.0000 mg | INTRAVENOUS | Status: AC
  Administered 2022-05-05: 400 mg via INTRAVENOUS

## 2022-05-04 MED ORDER — ORAL CARE MOUTH RINSE: 15.0000 mL | Freq: Once | OROMUCOSAL | Status: AC

## 2022-05-04 MED ORDER — LACTATED RINGERS IV SOLN: INTRAVENOUS | Status: DC

## 2022-05-04 NOTE — Progress Notes (Signed)
Pt was a phone call today, the day prior to surgery to do her anesthesia interview. Pt is working today in [place] and needs to come in to get her K+ rechecked from when she was in the ED on 04-16-22. K+ was 3.1.Pt unable to come in. I Instructed pt to eat foods high in K+ today and we will have to check K+ in the morning prior to her surgery

## 2022-05-04 NOTE — Patient Instructions (Signed)
Your procedure is scheduled on:05-05-22 Friday Report to the Registration Desk on the 1st floor of the [facility].Then proceed to the 2nd floor Surgery Desk To find out your arrival time, please call [phone] between 1PM - 3PM on:05-04-22 Thursday If your arrival time is 6:00 am, do not arrive before that time as the [facility] entrance doors do not open until 6:00 am.  REMEMBER: Instructions that are not followed completely may result in serious medical risk, up to and including death; or upon the discretion of your surgeon and anesthesiologist your surgery may need to be rescheduled.  Do not eat food OR drink any liquids after midnight the night before surgery.  No gum chewing or hard candies.  One week prior to surgery: Stop Anti-inflammatories (NSAIDS) such as Advil, Aleve, Ibuprofen, Motrin, Naproxen, Naprosyn and Aspirin based products such as Excedrin, Goody's Powder, BC Powder. You may however, continue to take Tylenol/Hydrocodone if needed for pain up until the day of surgery.  TAKE ONLY THESE MEDICATIONS THE MORNING OF SURGERY WITH A SIP OF WATER: -tamsulosin (FLOMAX)  -omeprazole (PRILOSEC)-take one the night before and one on the morning of surgery - helps to prevent nausea after surgery.)  No Alcohol for 24 hours before or after surgery.  No Smoking including e-cigarettes for 24 hours before surgery.  No chewable tobacco products for at least 6 hours before surgery.  No nicotine patches on the day of surgery.  Do not use any "recreational" drugs for at least a week (preferably 2 weeks) before your surgery.  Please be advised that the combination of cocaine and anesthesia may have negative outcomes, up to and including death. If you test positive for cocaine, your surgery will be cancelled.  On the morning of surgery brush your teeth with toothpaste and water, you may rinse your mouth with mouthwash if you wish. Do not swallow any toothpaste or mouthwash.  Do not  wear jewelry, make-up, hairpins, clips or nail polish.  Do not wear lotions, powders, or perfumes.   Do not shave body hair from the neck down 48 hours before surgery.  Contact lenses, hearing aids and dentures may not be worn into surgery.  Do not bring valuables to the hospital. [facility] is not responsible for any missing/lost belongings or valuables.   Notify your doctor if there is any change in your medical condition (cold, fever, infection).  Wear comfortable clothing (specific to your surgery type) to the hospital.  After surgery, you can help prevent lung complications by doing breathing exercises.  Take deep breaths and cough every 1-2 hours. Your doctor may order a device called an Incentive Spirometer to help you take deep breaths. When coughing or sneezing, hold a pillow firmly against your incision with both hands. This is called "splinting." Doing this helps protect your incision. It also decreases belly discomfort.  If you are being admitted to the hospital overnight, leave your suitcase in the car. After surgery it may be brought to your room.  In case of increased patient census, it may be necessary for you, the patient, to continue your postoperative care in the Same Day Surgery department.  If you are being discharged the day of surgery, you will not be allowed to drive home. You will need a responsible individual to drive you home and stay with you for 24 hours after surgery.   If you are taking public transportation, you will need to have a responsible individual with you.  Please call the Pre-admissions  Testing Dept. at [phone] if you have any questions about these instructions.  Surgery Visitation Policy:  Patients undergoing a surgery or procedure may have two family members or support persons with them as long as the person is not COVID-19 positive or experiencing its symptoms.   Due to an increase in RSV and influenza rates and associated  hospitalizations, children ages 57 and under will not be able to visit patients in [facility]. Masks continue to be strongly recommended.

## 2022-05-05 ENCOUNTER — Encounter: Payer: Self-pay | Admitting: Urology

## 2022-05-05 ENCOUNTER — Ambulatory Visit
Admission: RE | Admit: 2022-05-05 | Discharge: 2022-05-05 | Disposition: A | Discharge status: Home / Self Care | Payer: Federal, State, Local not specified - PPO | Source: Ambulatory Visit | Attending: Urology | Admitting: Urology

## 2022-05-05 ENCOUNTER — Ambulatory Visit: Payer: Federal, State, Local not specified - PPO | Admitting: Anesthesiology

## 2022-05-05 ENCOUNTER — Encounter
Admission: RE | Disposition: A | Discharge status: Home / Self Care | Payer: Self-pay | Source: Ambulatory Visit | Attending: Urology

## 2022-05-05 ENCOUNTER — Ambulatory Visit: Payer: Federal, State, Local not specified - PPO

## 2022-05-05 ENCOUNTER — Other Ambulatory Visit: Payer: Self-pay

## 2022-05-05 DIAGNOSIS — N132 Hydronephrosis with renal and ureteral calculous obstruction: Secondary | ICD-10-CM | POA: Insufficient documentation

## 2022-05-05 DIAGNOSIS — I10 Essential (primary) hypertension: Secondary | ICD-10-CM | POA: Insufficient documentation

## 2022-05-05 DIAGNOSIS — K219 Gastro-esophageal reflux disease without esophagitis: Secondary | ICD-10-CM | POA: Insufficient documentation

## 2022-05-05 DIAGNOSIS — Z01818 Encounter for other preprocedural examination: Secondary | ICD-10-CM

## 2022-05-05 DIAGNOSIS — N202 Calculus of kidney with calculus of ureter: Secondary | ICD-10-CM | POA: Diagnosis not present

## 2022-05-05 DIAGNOSIS — N201 Calculus of ureter: Secondary | ICD-10-CM

## 2022-05-05 DIAGNOSIS — F418 Other specified anxiety disorders: Secondary | ICD-10-CM | POA: Diagnosis not present

## 2022-05-05 HISTORY — PX: CYSTOSCOPY/URETEROSCOPY/HOLMIUM LASER/STENT PLACEMENT: SHX6546

## 2022-05-05 LAB — POCT I-STAT, CHEM 8
BUN: 24 mg/dL — ABNORMAL HIGH (ref 8–23)
Calcium, Ion: 1.07 mmol/L — ABNORMAL LOW (ref 1.15–1.40)
Chloride: 109 mmol/L (ref 98–111)
Creatinine, Ser: 0.8 mg/dL (ref 0.44–1.00)
Glucose, Bld: 98 mg/dL (ref 70–99)
HCT: 48 % — ABNORMAL HIGH (ref 36.0–46.0)
Hemoglobin: 16.3 g/dL — ABNORMAL HIGH (ref 12.0–15.0)
Potassium: 4.4 mmol/L (ref 3.5–5.1)
Sodium: 139 mmol/L (ref 135–145)
TCO2: 26 mmol/L (ref 22–32)

## 2022-05-05 SURGERY — CYSTOSCOPY/URETEROSCOPY/HOLMIUM LASER/STENT PLACEMENT
Anesthesia: General | Site: Ureter | Laterality: Left

## 2022-05-05 MED ORDER — OXYCODONE HCL 5 MG PO TABS: 5.0000 mg | ORAL_TABLET | Freq: Once | ORAL | Status: AC | PRN

## 2022-05-05 MED ORDER — KETOROLAC TROMETHAMINE 30 MG/ML IJ SOLN
INTRAMUSCULAR | Status: DC | PRN
  Administered 2022-05-05: 15 mg via INTRAVENOUS

## 2022-05-05 MED ORDER — ONDANSETRON HCL 4 MG/2ML IJ SOLN
INTRAMUSCULAR | Status: DC | PRN
  Administered 2022-05-05: 4 mg via INTRAVENOUS

## 2022-05-05 MED ORDER — PROPOFOL 10 MG/ML IV BOLUS
INTRAVENOUS | Status: DC | PRN
  Administered 2022-05-05: 170 mg via INTRAVENOUS

## 2022-05-05 MED ORDER — FENTANYL CITRATE (PF) 100 MCG/2ML IJ SOLN
INTRAMUSCULAR | Status: AC
  Filled 2022-05-05: qty 2

## 2022-05-05 MED ORDER — FENTANYL CITRATE (PF) 100 MCG/2ML IJ SOLN
INTRAMUSCULAR | Status: AC
  Administered 2022-05-05: 50 ug via INTRAVENOUS
  Filled 2022-05-05: qty 2

## 2022-05-05 MED ORDER — FENTANYL CITRATE (PF) 100 MCG/2ML IJ SOLN
25.0000 ug | INTRAMUSCULAR | Status: DC | PRN
  Administered 2022-05-05: 50 ug via INTRAVENOUS

## 2022-05-05 MED ORDER — CHLORHEXIDINE GLUCONATE 0.12 % MT SOLN
OROMUCOSAL | Status: AC
  Administered 2022-05-05: 15 mL via OROMUCOSAL
  Filled 2022-05-05: qty 15

## 2022-05-05 MED ORDER — PROPOFOL 10 MG/ML IV BOLUS
INTRAVENOUS | Status: AC
  Filled 2022-05-05: qty 40

## 2022-05-05 MED ORDER — SODIUM CHLORIDE 0.9 % IR SOLN
Status: DC | PRN
  Administered 2022-05-05: 3000 mL

## 2022-05-05 MED ORDER — MIDAZOLAM HCL 2 MG/2ML IJ SOLN
INTRAMUSCULAR | Status: DC | PRN
  Administered 2022-05-05: 2 mg via INTRAVENOUS

## 2022-05-05 MED ORDER — FENTANYL CITRATE (PF) 100 MCG/2ML IJ SOLN
INTRAMUSCULAR | Status: DC | PRN
  Administered 2022-05-05 (×2): 50 ug via INTRAVENOUS

## 2022-05-05 MED ORDER — KETAMINE HCL 50 MG/5ML IJ SOSY
PREFILLED_SYRINGE | INTRAMUSCULAR | Status: AC
  Filled 2022-05-05: qty 5

## 2022-05-05 MED ORDER — LIDOCAINE HCL (CARDIAC) PF 100 MG/5ML IV SOSY
PREFILLED_SYRINGE | INTRAVENOUS | Status: DC | PRN
  Administered 2022-05-05: 100 mg via INTRAVENOUS

## 2022-05-05 MED ORDER — DEXAMETHASONE SODIUM PHOSPHATE 10 MG/ML IJ SOLN
INTRAMUSCULAR | Status: DC | PRN
  Administered 2022-05-05: 10 mg via INTRAVENOUS

## 2022-05-05 MED ORDER — IOHEXOL 180 MG/ML  SOLN
INTRAMUSCULAR | Status: DC | PRN
  Administered 2022-05-05: 10 mL

## 2022-05-05 MED ORDER — MIDAZOLAM HCL 2 MG/2ML IJ SOLN
INTRAMUSCULAR | Status: AC
  Filled 2022-05-05: qty 2

## 2022-05-05 MED ORDER — OXYCODONE HCL 5 MG PO TABS
ORAL_TABLET | ORAL | Status: AC
  Administered 2022-05-05: 5 mg via ORAL
  Filled 2022-05-05: qty 1

## 2022-05-05 MED ORDER — HYDROCODONE-ACETAMINOPHEN 5-325 MG PO TABS: 1.0000 | ORAL_TABLET | Freq: Four times a day (QID) | ORAL | 0 refills | Status: AC | PRN

## 2022-05-05 MED ORDER — CIPROFLOXACIN IN D5W 400 MG/200ML IV SOLN
INTRAVENOUS | Status: AC
  Filled 2022-05-05: qty 200

## 2022-05-05 MED ORDER — OXYCODONE HCL 5 MG/5ML PO SOLN: 5.0000 mg | Freq: Once | ORAL | Status: AC | PRN

## 2022-05-05 SURGICAL SUPPLY — 30 items
ADH LQ OCL WTPRF AMP STRL LF (MISCELLANEOUS)
ADHESIVE MASTISOL STRL (MISCELLANEOUS) IMPLANT
BAG DRAIN SIEMENS DORNER NS (MISCELLANEOUS) ×1 IMPLANT
BAG DRN NS LF (MISCELLANEOUS) ×1
BAG PRESSURE INF REUSE 3000 (BAG) ×1 IMPLANT
BRUSH SCRUB EZ 1% IODOPHOR (MISCELLANEOUS) ×1 IMPLANT
CATH URET FLEX-TIP 2 LUMEN 10F (CATHETERS) IMPLANT
CATH URETL OPEN 5X70 (CATHETERS) IMPLANT
CNTNR URN SCR LID CUP LEK RST (MISCELLANEOUS) IMPLANT
CONT SPEC 4OZ STRL OR WHT (MISCELLANEOUS)
DRAPE UTILITY 15X26 TOWEL STRL (DRAPES) ×1 IMPLANT
DRSG TEGADERM 2-3/8X2-3/4 SM (GAUZE/BANDAGES/DRESSINGS) IMPLANT
FIBER LASER MOSES 200 DFL (Laser) IMPLANT
GLOVE SURG UNDER POLY LF SZ7.5 (GLOVE) ×1 IMPLANT
GOWN STRL REUS W/ TWL LRG LVL3 (GOWN DISPOSABLE) ×1 IMPLANT
GOWN STRL REUS W/ TWL XL LVL3 (GOWN DISPOSABLE) ×1 IMPLANT
GOWN STRL REUS W/TWL LRG LVL3 (GOWN DISPOSABLE) ×1
GOWN STRL REUS W/TWL XL LVL3 (GOWN DISPOSABLE) ×1
GUIDEWIRE STR DUAL SENSOR (WIRE) ×1 IMPLANT
IV NS IRRIG 3000ML ARTHROMATIC (IV SOLUTION) ×1 IMPLANT
KIT TURNOVER CYSTO (KITS) ×1 IMPLANT
PACK CYSTO AR (MISCELLANEOUS) ×1 IMPLANT
SET CYSTO W/LG BORE CLAMP LF (SET/KITS/TRAYS/PACK) ×1 IMPLANT
SHEATH NAVIGATOR HD 12/14X36 (SHEATH) IMPLANT
STENT URET 6FRX24 CONTOUR (STENTS) IMPLANT
STENT URET 6FRX26 CONTOUR (STENTS) IMPLANT
SURGILUBE 2OZ TUBE FLIPTOP (MISCELLANEOUS) ×1 IMPLANT
SYR 10ML LL (SYRINGE) ×1 IMPLANT
VALVE UROSEAL ADJ ENDO (VALVE) IMPLANT
WATER STERILE IRR 500ML POUR (IV SOLUTION) ×1 IMPLANT

## 2022-05-05 NOTE — Op Note (Signed)
Date of procedure: 05/05/22  Preoperative diagnosis:  Left distal ureteral stone  Postoperative diagnosis:  Same  Procedure: Cystoscopy, left ureteroscopy, laser lithotripsy, left retrograde pyelogram with intraoperative interpretation, left ureteral stent placement  Surgeon: Nickolas Madrid, MD  Anesthesia: General  Complications: None  Intraoperative findings:  Impacted left distal ureteral stone, dusted, stent placed  EBL: Minimal  Specimens: None  Drains: Left 6 French by 24 cm ureteral stent  Indication: Tammy Gregory is a 62 y.o. patient with flank pain and CT showing a 6 mm left distal ureteral stone, she failed a trial of medical expulsive therapy and opted for ureteroscopy.  After reviewing the management options for treatment, they elected to proceed with the above surgical procedure(s). We have discussed the potential benefits and risks of the procedure, side effects of the proposed treatment, the likelihood of the patient achieving the goals of the procedure, and any potential problems that might occur during the procedure or recuperation. Informed consent has been obtained.  Description of procedure:  The patient was taken to the operating room and general anesthesia was induced. SCDs were placed for DVT prophylaxis. The patient was placed in the dorsal lithotomy position, prepped and draped in the usual sterile fashion, and preoperative antibiotics were administered. A preoperative time-out was performed.   A 21 French rigid cystoscope was used to intubate the urethra and thorough cystoscopy was performed.  The bladder was grossly normal.  With the aid of an access catheter a sensor wire was advanced into the left ureteral orifice alongside the stone up to the kidney under fluoroscopic vision.  A semirigid short ureteroscope was advanced alongside the wire into the left distal ureter, and there was a yellow stone completely impacted in the distal ureter.  A 200  m laser fiber on settings of 1.0 J and 10 Hz was used to very carefully fragment the stone, and all fragments were irrigated free from the ureter.  The scope was advanced into the proximal ureter and a retrograde pyelogram was performed showing no extravasation or filling defects.  Careful pullback ureteroscopy showed no additional fragments however there was moderate erythema and edema in the distal ureter where the stone had been impacted.  A 6 French by 24 cm ureteral stent was placed fluoroscopically with an excellent curl in the renal pelvis as well as in the bladder.  The bladder was drained and this concluded our procedure.  Disposition: Stable to PACU  Plan: Stent removal in clinic in 1 week  Nickolas Madrid, MD

## 2022-05-05 NOTE — Anesthesia Postprocedure Evaluation (Signed)
Anesthesia Post Note  Patient: Tammy Gregory  Procedure(s) Performed: CYSTOSCOPY/URETEROSCOPY/HOLMIUM LASER/STENT PLACEMENT (Left: Ureter)  Patient location during evaluation: PACU Anesthesia Type: General Level of consciousness: awake and alert Pain management: pain level controlled Vital Signs Assessment: post-procedure vital signs reviewed and stable Respiratory status: spontaneous breathing, nonlabored ventilation, respiratory function stable and patient connected to nasal cannula oxygen Cardiovascular status: blood pressure returned to baseline and stable Postop Assessment: no apparent nausea or vomiting Anesthetic complications: no  No notable events documented.   Last Vitals:  Vitals:   05/05/22 1645 05/05/22 1650  BP: 126/69   Pulse: 85 85  Resp: 16 20  Temp:    SpO2: 100% 97%    Last Pain:  Vitals:   05/05/22 1650  PainSc: [place]

## 2022-05-05 NOTE — Transfer of Care (Signed)
Immediate Anesthesia Transfer of Care Note  Patient: Tammy Gregory  Procedure(s) Performed: CYSTOSCOPY/URETEROSCOPY/HOLMIUM LASER/STENT PLACEMENT (Left: Ureter)  Patient Location: PACU  Anesthesia Type:General  Level of Consciousness: awake, alert , and oriented  Airway & Oxygen Therapy: Patient Spontanous Breathing and Patient connected to nasal cannula oxygen  Post-op Assessment: Report given to RN, Post -op Vital signs reviewed and stable, and Patient moving all extremities  Post vital signs: Reviewed and stable  Last Vitals:  Vitals Value Taken Time  BP 138/76 05/05/22 1635  Temp 36.5 C 05/05/22 1635  Pulse 77 05/05/22 1637  Resp 16 05/05/22 1637  SpO2 100 % 05/05/22 1637  Vitals shown include unvalidated device data.  Last Pain:  Vitals:   05/05/22 1635  PainSc: Asleep         Complications: No notable events documented.

## 2022-05-05 NOTE — Interval H&P Note (Signed)
UROLOGY H&P UPDATE  Agree with prior H&P dated 04/26/2022, 6 mm left ureteral stone with intermittent flank pain  Cardiac: RRR Lungs: CTA bilaterally  Laterality: Left Procedure: Left ureteroscopy, laser lithotripsy, stent placement  Urine: UA 2/21 benign  We specifically discussed the risks ureteroscopy including bleeding, infection/sepsis, stent related symptoms including flank pain/urgency/frequency/incontinence/dysuria, ureteral injury, inability to access stone, or need for staged or additional procedures.   Billey Co, MD 05/05/2022

## 2022-05-05 NOTE — Anesthesia Procedure Notes (Signed)
Procedure Name: LMA Insertion Date/Time: 05/05/2022 4:02 PM  Performed by: Esaw Grandchild, CRNAPre-anesthesia Checklist: Patient identified, Emergency Drugs available, Suction available and Patient being monitored Patient Re-evaluated:Patient Re-evaluated prior to induction Oxygen Delivery Method: Circle system utilized Preoxygenation: Pre-oxygenation with 100% oxygen Induction Type: IV induction Number of attempts: 1 Placement Confirmation: positive ETCO2 and breath sounds checked- equal and bilateral Tube secured with: Tape Dental Injury: Teeth and Oropharynx as per pre-operative assessment

## 2022-05-05 NOTE — Anesthesia Preprocedure Evaluation (Signed)
Anesthesia Evaluation  Patient identified by MRN, date of birth, ID band Patient awake    Reviewed: Allergy & Precautions, NPO status , Patient's Chart, lab work & pertinent test results  History of Anesthesia Complications Negative for: history of anesthetic complications  Airway Mallampati: III  TM Distance: >3 FB Neck ROM: full    Dental no notable dental hx.    Pulmonary neg pulmonary ROS   Pulmonary exam normal        Cardiovascular hypertension, On Medications negative cardio ROS Normal cardiovascular exam     Neuro/Psych History of cervical fusion   Neuromuscular disease  negative psych ROS   GI/Hepatic Neg liver ROS,GERD  ,,  Endo/Other  negative endocrine ROS    Renal/GU      Musculoskeletal   Abdominal   Peds  Hematology negative hematology ROS (+)   Anesthesia Other Findings Past Medical History: No date: Anxiety No date: Chicken pox No date: Depression No date: Fatty liver No date: GERD (gastroesophageal reflux disease) No date: Headache No date: History of kidney stones No date: Hypertension No date: Insomnia No date: Obesity No date: Pure hypercholesterolemia  Past Surgical History: 06/09/2021: ANTERIOR CERVICAL DECOMP/DISCECTOMY FUSION; N/A     Comment:  Procedure: Anterior Cervical Discectomy Fusion -               Cervical seven-Thoracic one;  Surgeon: Earnie Larsson, MD;                Location: [facility];  Service: Neurosurgery;  Laterality:               N/A; 12/03/2014: CERVICAL FUSION No date: CESAREAN SECTION     Comment:  3 04/02/2015: COLONOSCOPY WITH PROPOFOL; N/A     Comment:  Procedure: COLONOSCOPY WITH PROPOFOL;  Surgeon: Lollie Sails, MD;  Location: [facility] ENDOSCOPY;  Service:               Endoscopy;  Laterality: N/A; No date: TUBAL LIGATION     Reproductive/Obstetrics negative OB ROS                             Anesthesia  Physical Anesthesia Plan  ASA: 2  Anesthesia Plan: General ETT   Post-op Pain Management: Ofirmev IV (intra-op)* and Toradol IV (intra-op)*   Induction: Intravenous  PONV Risk Score and Plan: Ondansetron, Dexamethasone, Midazolam and Treatment may vary due to age or medical condition  Airway Management Planned: Oral ETT  Additional Equipment:   Intra-op Plan:   Post-operative Plan: Extubation in OR  Informed Consent: I have reviewed the patients History and Physical, chart, labs and discussed the procedure including the risks, benefits and alternatives for the proposed anesthesia with the patient or authorized representative who has indicated his/her understanding and acceptance.     Dental Advisory Given  Plan Discussed with: Anesthesiologist, CRNA and Surgeon  Anesthesia Plan Comments: (Patient consented for risks of anesthesia including but not limited to:  - adverse reactions to medications - damage to eyes, teeth, lips or other oral mucosa - nerve damage due to positioning  - sore throat or hoarseness - Damage to heart, brain, nerves, lungs, other parts of body or loss of life  Patient voiced understanding.)       Anesthesia Quick Evaluation

## 2022-05-06 ENCOUNTER — Encounter: Payer: Self-pay | Admitting: Urology

## 2022-05-11 ENCOUNTER — Telehealth: Payer: Self-pay

## 2022-05-11 DIAGNOSIS — T8384XA Pain from genitourinary prosthetic devices, implants and grafts, initial encounter: Secondary | ICD-10-CM

## 2022-05-11 MED ORDER — OXYBUTYNIN CHLORIDE ER 10 MG PO TB24: 10.0000 mg | ORAL_TABLET | Freq: Every day | ORAL | 0 refills | Status: DC

## 2022-05-11 MED ORDER — KETOROLAC TROMETHAMINE 10 MG PO TABS: 10.0000 mg | ORAL_TABLET | Freq: Three times a day (TID) | ORAL | 0 refills | Status: DC | PRN

## 2022-05-11 NOTE — Telephone Encounter (Signed)
Called pt informed her that both medications oxybutynin and Toradol sent into the pharmacy per Dr. Diamantina Providence. Pt very appreciative.

## 2022-05-11 NOTE — Telephone Encounter (Signed)
Pt calls triage line and states that she has been doing well with stent until yesterday at which time she started experiencing sharp pain that she describes as similar to the pain she initially had with the stone. She denies fever, chills, or n/v. Hematuria has been minimal. Yesterday she took a Norco 5-325 which provided relief, today has taken ibuprofen which has "taken the edge off" but she is still having significant pain and discomfort. Also complains of bladder cramping. She is questioning if she can have a refill on Norco or Toradol. Please advise on pain meds and if ok to send short course of oxybutynin.

## 2022-05-15 ENCOUNTER — Other Ambulatory Visit: Payer: Self-pay

## 2022-05-15 DIAGNOSIS — Z466 Encounter for fitting and adjustment of urinary device: Secondary | ICD-10-CM

## 2022-05-16 ENCOUNTER — Encounter: Payer: Self-pay | Admitting: Urology

## 2022-05-16 ENCOUNTER — Ambulatory Visit (INDEPENDENT_AMBULATORY_CARE_PROVIDER_SITE_OTHER): Payer: Federal, State, Local not specified - PPO | Admitting: Urology

## 2022-05-16 VITALS — BP 155/79 | HR 79 | Ht 62.0 in | Wt 192.0 lb

## 2022-05-16 DIAGNOSIS — N200 Calculus of kidney: Secondary | ICD-10-CM

## 2022-05-16 DIAGNOSIS — Z466 Encounter for fitting and adjustment of urinary device: Secondary | ICD-10-CM

## 2022-05-16 MED ORDER — SULFAMETHOXAZOLE-TRIMETHOPRIM 800-160 MG PO TABS
1.0000 | ORAL_TABLET | Freq: Once | ORAL | Status: AC
  Administered 2022-05-16: 1 via ORAL

## 2022-05-16 NOTE — Progress Notes (Signed)
Cystoscopy Procedure Note:  Indication: Stent removal s/p left ureteroscopy, laser, stent for 6 mm distal ureteral stone  Bactrim given for prophylaxis  After informed consent and discussion of the procedure and its risks, Tammy Gregory was positioned and prepped in the standard fashion. Cystoscopy was performed with a flexible cystoscope. The stent was grasped with flexible graspers and removed in its entirety. The patient tolerated the procedure well.  Findings: Uncomplicated stent removal  Assessment and Plan: We discussed general stone prevention strategies including adequate hydration with goal of producing 2.5 L of urine daily, increasing citric acid intake, increasing calcium intake during high oxalate meals, minimizing animal protein, and decreasing salt intake. Information about dietary recommendations given today.   Billey Co, MD 05/16/2022

## 2022-05-16 NOTE — Addendum Note (Signed)
Addended by: Despina Hidden on: 05/16/2022 10:01 AM   Modules accepted: Orders

## 2022-05-17 ENCOUNTER — Encounter: Payer: Federal, State, Local not specified - PPO | Admitting: Urology

## 2022-05-19 ENCOUNTER — Other Ambulatory Visit: Payer: Self-pay | Admitting: Urology

## 2022-06-16 DIAGNOSIS — G479 Sleep disorder, unspecified: Secondary | ICD-10-CM | POA: Diagnosis not present

## 2022-06-16 DIAGNOSIS — R413 Other amnesia: Secondary | ICD-10-CM | POA: Diagnosis not present

## 2022-06-16 DIAGNOSIS — R200 Anesthesia of skin: Secondary | ICD-10-CM | POA: Diagnosis not present

## 2022-06-16 DIAGNOSIS — F411 Generalized anxiety disorder: Secondary | ICD-10-CM | POA: Diagnosis not present

## 2022-06-27 ENCOUNTER — Other Ambulatory Visit: Payer: Self-pay | Admitting: Urology

## 2022-07-28 DIAGNOSIS — R252 Cramp and spasm: Secondary | ICD-10-CM | POA: Diagnosis not present

## 2022-07-28 DIAGNOSIS — G44209 Tension-type headache, unspecified, not intractable: Secondary | ICD-10-CM | POA: Diagnosis not present

## 2022-07-28 DIAGNOSIS — R42 Dizziness and giddiness: Secondary | ICD-10-CM | POA: Diagnosis not present

## 2022-08-04 DIAGNOSIS — H57813 Brow ptosis, bilateral: Secondary | ICD-10-CM | POA: Diagnosis not present

## 2022-08-04 DIAGNOSIS — H02834 Dermatochalasis of left upper eyelid: Secondary | ICD-10-CM | POA: Diagnosis not present

## 2022-08-04 DIAGNOSIS — H02831 Dermatochalasis of right upper eyelid: Secondary | ICD-10-CM | POA: Diagnosis not present

## 2022-08-18 DIAGNOSIS — M170 Bilateral primary osteoarthritis of knee: Secondary | ICD-10-CM | POA: Diagnosis not present

## 2022-08-28 ENCOUNTER — Encounter: Payer: Self-pay | Admitting: Family Medicine

## 2022-09-04 NOTE — Telephone Encounter (Signed)
Patient has been scheduled for an appointment to see Dr. Marikay Alar on 09/08/2022.

## 2022-09-08 ENCOUNTER — Ambulatory Visit (INDEPENDENT_AMBULATORY_CARE_PROVIDER_SITE_OTHER): Payer: Federal, State, Local not specified - PPO | Admitting: Family Medicine

## 2022-09-08 ENCOUNTER — Encounter: Payer: Self-pay | Admitting: Family Medicine

## 2022-09-08 VITALS — BP 126/82 | HR 81 | Ht 62.0 in | Wt 192.0 lb

## 2022-09-08 DIAGNOSIS — F32A Depression, unspecified: Secondary | ICD-10-CM

## 2022-09-08 DIAGNOSIS — R252 Cramp and spasm: Secondary | ICD-10-CM

## 2022-09-08 DIAGNOSIS — F419 Anxiety disorder, unspecified: Secondary | ICD-10-CM

## 2022-09-08 DIAGNOSIS — I10 Essential (primary) hypertension: Secondary | ICD-10-CM

## 2022-09-08 DIAGNOSIS — Z1231 Encounter for screening mammogram for malignant neoplasm of breast: Secondary | ICD-10-CM

## 2022-09-08 LAB — BASIC METABOLIC PANEL
BUN: 24 mg/dL — ABNORMAL HIGH (ref 6–23)
CO2: 28 mEq/L (ref 19–32)
Calcium: 9.3 mg/dL (ref 8.4–10.5)
Chloride: 106 mEq/L (ref 96–112)
Creatinine, Ser: 0.93 mg/dL (ref 0.40–1.20)
GFR: 66.02 mL/min (ref >60.00)
Glucose, Bld: 99 mg/dL (ref 70–99)
Potassium: 4.1 mEq/L (ref 3.5–5.1)
Sodium: 141 mEq/L (ref 135–145)

## 2022-09-08 LAB — MAGNESIUM: Magnesium: 2 mg/dL (ref 1.5–2.5)

## 2022-09-08 MED ORDER — TRAZODONE HCL 50 MG PO TABS: 75.0000 mg | ORAL_TABLET | Freq: Every day | ORAL | 1 refills | Status: DC

## 2022-09-08 MED ORDER — CYCLOBENZAPRINE HCL 5 MG PO TABS: 5.0000 mg | ORAL_TABLET | Freq: Three times a day (TID) | ORAL | 1 refills | Status: DC | PRN

## 2022-09-08 NOTE — Patient Instructions (Signed)
Nice to see you. It looks like you are due for mammogram.  You can call [phone] to get this scheduled. Please try scheduling your yellow mustard intake to see if this helps with your cramping in the afternoon and nighttime.  Please increase your water intake and try to drink 1 sugar-free Gatorade per day to see if that helps as well. Will get lab work today and contact you with the results.

## 2022-09-08 NOTE — Progress Notes (Signed)
Marikay Alar, MD Phone: [phone]  Tammy Gregory Tammy Gregory is a 62 y.o. female who presents today for f/u.  Hypertension: Patient is not checking blood pressures at home.  She notes no chest pain.  She does note her blood pressure felt like it was elevated a month or so ago and she took an amlodipine once daily for 2 days.  The 10-year ASCVD risk score (Arnett DK, et al., 2019) is: 3.4%   Values used to calculate the score:     Age: 68 years     Sex: Female     Is Non-Hispanic African American: No     Diabetic: No     Tobacco smoker: No     Systolic Blood Pressure: 126 mmHg     Is BP treated: No     HDL Cholesterol: 62 mg/dL     Total Cholesterol: 181 mg/dL   Muscle cramps: This is a chronic ongoing issue.  They mostly occur in the evening and at night.  She typically drinks 48 to 64 ounces of water daily though sometimes drinks less.  She does not stretch as it seems to cause her cramps to worsen.  She stopped her Cymbalta to see if that was contributing to her cramps and does note stopping that helped some.  Notes cramping mostly in her hands and legs.  Patient does note yellow mustard seems to help.  Anxiety/depression: Patient notes continued issues with anxiety and depression.  She stopped the Cymbalta and notes that it has allowed her to feel like she has emotions again.  She notes lots of stressors in her life with the divorce coming up and premature grandchildren.  One of her grandchildren was diagnosed with a rare disorder after birth.  No SI.  Increase her trazodone to 75 mg daily nightly as needed for sleep.  Social History   Tobacco Use  Smoking Status Never   Passive exposure: Never  Smokeless Tobacco Never    Current Outpatient Medications on File Prior to Visit  Medication Sig Dispense Refill   omeprazole (PRILOSEC) 20 MG capsule Take 20 mg by mouth as needed.     HYDROcodone-acetaminophen (NORCO) 5-325 MG tablet Take 1 tablet by mouth every 6 (six) hours as  needed for severe pain. (Patient not taking: Reported on 09/08/2022) 10 tablet 0   ketorolac (TORADOL) 10 MG tablet Take 1 tablet (10 mg total) by mouth every 8 (eight) hours as needed for severe pain. (Patient not taking: Reported on 09/08/2022) 10 tablet 0   No current facility-administered medications on file prior to visit.     ROS see history of present illness  Objective  Physical Exam Vitals:   09/08/22 0942 09/08/22 1002  BP: 138/82 126/82  Pulse: 81   SpO2: 96%     BP Readings from Last 3 Encounters:  09/08/22 126/82  05/16/22 (!) 155/79  05/05/22 132/68   Wt Readings from Last 3 Encounters:  09/08/22 192 lb (87.1 kg)  05/16/22 192 lb (87.1 kg)  05/05/22 184 lb 1.4 oz (83.5 kg)    Physical Exam Constitutional:      General: She is not in acute distress.    Appearance: She is not diaphoretic.  Cardiovascular:     Rate and Rhythm: Normal rate and regular rhythm.     Heart sounds: Normal heart sounds.  Pulmonary:     Effort: Pulmonary effort is normal.     Breath sounds: Normal breath sounds.  Skin:    General: Skin is  warm and dry.  Neurological:     Mental Status: She is alert.      Assessment/Plan: Please see individual problem list.  Primary hypertension Assessment & Plan: Chronic issue.  At goal.  Monitor.   Muscle cramps Assessment & Plan: Chronic ongoing issue.  Discussed increasing hydration to 64 ounces water daily and taking an 1 bottle of sugar-free Gatorade per day to see if this is beneficial.  Advised the patient could schedule yellow mustard early in the afternoon to try to prevent muscle cramps from occurring.  I will provide her with Flexeril 5 mg 3 times daily as needed for cramps.  She will monitor for drowsiness with this.  Plan to recheck electrolytes today.  Orders: -     Basic metabolic panel -     Magnesium -     Cyclobenzaprine HCl; Take 1 tablet (5 mg total) by mouth 3 (three) times daily as needed for muscle spasms.  Dispense:  30 tablet; Refill: 1  Anxiety and depression Assessment & Plan: Chronic issue.  Patient has come off of Cymbalta.  She will remain off of this and opts not to start another medication at this time.  She will continue trazodone 75 mg nightly.  She will let us know if anything worsens or if she would like to try an additional medication in the future.  Orders: -     traZODone HCl; Take 1.5 tablets (75 mg total) by mouth at bedtime.  Dispense: 135 tablet; Refill: 1  Encounter for screening mammogram for malignant neoplasm of breast -     3D Screening Mammogram, Left and Right; Future     Return in about 6 months (around 03/11/2023).   Marikay Alar, MD [facility]

## 2022-09-08 NOTE — Assessment & Plan Note (Signed)
Chronic ongoing issue.  Discussed increasing hydration to 64 ounces water daily and taking an 1 bottle of sugar-free Gatorade per day to see if this is beneficial.  Advised the patient could schedule yellow mustard early in the afternoon to try to prevent muscle cramps from occurring.  I will provide her with Flexeril 5 mg 3 times daily as needed for cramps.  She will monitor for drowsiness with this.  Plan to recheck electrolytes today.

## 2022-09-08 NOTE — Assessment & Plan Note (Signed)
Chronic issue.  Patient has come off of Cymbalta.  She will remain off of this and opts not to start another medication at this time.  She will continue trazodone 75 mg nightly.  She will let us know if anything worsens or if she would like to try an additional medication in the future.

## 2022-09-08 NOTE — Telephone Encounter (Signed)
Pt was seen on 09/08/2022.

## 2022-09-08 NOTE — Assessment & Plan Note (Signed)
Chronic issue.  At goal.  Monitor.

## 2022-09-15 ENCOUNTER — Ambulatory Visit: Payer: Federal, State, Local not specified - PPO | Admitting: Family Medicine

## 2022-09-15 DIAGNOSIS — Z124 Encounter for screening for malignant neoplasm of cervix: Secondary | ICD-10-CM | POA: Diagnosis not present

## 2022-09-15 DIAGNOSIS — Z01419 Encounter for gynecological examination (general) (routine) without abnormal findings: Secondary | ICD-10-CM | POA: Diagnosis not present

## 2022-09-15 DIAGNOSIS — R634 Abnormal weight loss: Secondary | ICD-10-CM | POA: Diagnosis not present

## 2022-09-18 DIAGNOSIS — H57813 Brow ptosis, bilateral: Secondary | ICD-10-CM | POA: Diagnosis not present

## 2022-09-26 ENCOUNTER — Other Ambulatory Visit: Payer: Self-pay | Admitting: Family Medicine

## 2022-09-26 DIAGNOSIS — F419 Anxiety disorder, unspecified: Secondary | ICD-10-CM

## 2022-09-28 ENCOUNTER — Inpatient Hospital Stay
Admission: RE | Admit: 2022-09-28 | Discharge: 2022-09-28 | Disposition: A | Discharge status: Home / Self Care | Payer: Self-pay | Source: Ambulatory Visit | Attending: Family Medicine | Admitting: Family Medicine

## 2022-09-28 ENCOUNTER — Other Ambulatory Visit: Payer: Self-pay | Admitting: *Deleted

## 2022-09-28 DIAGNOSIS — Z1231 Encounter for screening mammogram for malignant neoplasm of breast: Secondary | ICD-10-CM

## 2022-10-04 ENCOUNTER — Ambulatory Visit
Admission: RE | Admit: 2022-10-04 | Discharge: 2022-10-04 | Disposition: A | Discharge status: Home / Self Care | Payer: Federal, State, Local not specified - PPO | Source: Ambulatory Visit | Attending: Family Medicine | Admitting: Family Medicine

## 2022-10-04 ENCOUNTER — Other Ambulatory Visit: Payer: Self-pay | Admitting: *Deleted

## 2022-10-04 ENCOUNTER — Ambulatory Visit
Admission: RE | Admit: 2022-10-04 | Discharge: 2022-10-04 | Disposition: A | Discharge status: Home / Self Care | Payer: Self-pay | Source: Ambulatory Visit | Attending: Family Medicine | Admitting: Family Medicine

## 2022-10-04 DIAGNOSIS — Z1231 Encounter for screening mammogram for malignant neoplasm of breast: Secondary | ICD-10-CM

## 2022-10-27 ENCOUNTER — Other Ambulatory Visit: Payer: Self-pay | Admitting: Family Medicine

## 2022-10-27 DIAGNOSIS — F32A Depression, unspecified: Secondary | ICD-10-CM

## 2022-10-30 NOTE — Telephone Encounter (Signed)
Medication was discontinued on 05/04/2022. Is it okay to refuse refill?

## 2022-12-08 ENCOUNTER — Other Ambulatory Visit: Payer: Self-pay | Admitting: Family Medicine

## 2022-12-08 NOTE — Telephone Encounter (Signed)
Sent to pharmacy 

## 2022-12-24 ENCOUNTER — Encounter: Payer: Self-pay | Admitting: Family Medicine

## 2022-12-24 DIAGNOSIS — F32A Depression, unspecified: Secondary | ICD-10-CM

## 2022-12-25 MED ORDER — TRAZODONE HCL 50 MG PO TABS
75.0000 mg | ORAL_TABLET | Freq: Every day | ORAL | 1 refills | Status: DC
Start: 2022-12-25 — End: 2023-03-19

## 2023-03-16 ENCOUNTER — Ambulatory Visit: Payer: Federal, State, Local not specified - PPO | Admitting: Family Medicine

## 2023-03-19 ENCOUNTER — Ambulatory Visit (INDEPENDENT_AMBULATORY_CARE_PROVIDER_SITE_OTHER): Payer: Federal, State, Local not specified - PPO | Admitting: Family Medicine

## 2023-03-19 ENCOUNTER — Encounter: Payer: Self-pay | Admitting: Family Medicine

## 2023-03-19 VITALS — BP 132/80 | HR 76 | Temp 97.7°F | Ht 62.0 in | Wt 171.6 lb

## 2023-03-19 DIAGNOSIS — E7800 Pure hypercholesterolemia, unspecified: Secondary | ICD-10-CM | POA: Diagnosis not present

## 2023-03-19 DIAGNOSIS — R252 Cramp and spasm: Secondary | ICD-10-CM | POA: Diagnosis not present

## 2023-03-19 DIAGNOSIS — E66811 Obesity, class 1: Secondary | ICD-10-CM

## 2023-03-19 DIAGNOSIS — F32A Depression, unspecified: Secondary | ICD-10-CM

## 2023-03-19 DIAGNOSIS — Z6831 Body mass index (BMI) 31.0-31.9, adult: Secondary | ICD-10-CM

## 2023-03-19 DIAGNOSIS — I10 Essential (primary) hypertension: Secondary | ICD-10-CM

## 2023-03-19 DIAGNOSIS — J069 Acute upper respiratory infection, unspecified: Secondary | ICD-10-CM | POA: Insufficient documentation

## 2023-03-19 DIAGNOSIS — E6609 Other obesity due to excess calories: Secondary | ICD-10-CM

## 2023-03-19 DIAGNOSIS — F5101 Primary insomnia: Secondary | ICD-10-CM

## 2023-03-19 DIAGNOSIS — F419 Anxiety disorder, unspecified: Secondary | ICD-10-CM

## 2023-03-19 LAB — COMPREHENSIVE METABOLIC PANEL
ALT: 10 U/L (ref 0–35)
AST: 14 U/L (ref 0–37)
Albumin: 4.2 g/dL (ref 3.5–5.2)
Alkaline Phosphatase: 82 U/L (ref 39–117)
BUN: 25 mg/dL — ABNORMAL HIGH (ref 6–23)
CO2: 28 meq/L (ref 19–32)
Calcium: 9.1 mg/dL (ref 8.4–10.5)
Chloride: 109 meq/L (ref 96–112)
Creatinine, Ser: 0.9 mg/dL (ref 0.40–1.20)
GFR: 68.42 mL/min (ref >60.00)
Glucose, Bld: 86 mg/dL (ref 70–99)
Potassium: 3.9 meq/L (ref 3.5–5.1)
Sodium: 140 meq/L (ref 135–145)
Total Bilirubin: 0.4 mg/dL (ref 0.2–1.2)
Total Protein: 6.7 g/dL (ref 6.0–8.3)

## 2023-03-19 LAB — LIPID PANEL
Cholesterol: 164 mg/dL (ref 0–200)
HDL: 55.8 mg/dL (ref >39.00)
LDL Cholesterol: 88 mg/dL (ref 0–99)
NonHDL: 108.43
Total CHOL/HDL Ratio: 3
Triglycerides: 104 mg/dL (ref 0.0–149.0)
VLDL: 20.8 mg/dL (ref 0.0–40.0)

## 2023-03-19 LAB — HEMOGLOBIN A1C: Hgb A1c MFr Bld: 5.5 % (ref 4.6–6.5)

## 2023-03-19 MED ORDER — MELOXICAM 15 MG PO TABS: ORAL_TABLET | ORAL | 2 refills | Status: AC

## 2023-03-19 MED ORDER — TRAZODONE HCL 50 MG PO TABS: 75.0000 mg | ORAL_TABLET | Freq: Every day | ORAL | 1 refills | Status: AC

## 2023-03-19 MED ORDER — CYCLOBENZAPRINE HCL 5 MG PO TABS
5.0000 mg | ORAL_TABLET | Freq: Three times a day (TID) | ORAL | 1 refills | Status: AC | PRN
Start: 2023-03-19 — End: ?

## 2023-03-19 NOTE — Progress Notes (Signed)
 Camellia Her, MD Phone: [phone]  HAISLEE CORSO Claudene is a 63 y.o. female who presents today for f/u.  HYPERTENSION Disease Monitoring Home BP Monitoring not checking Chest pain- no    Dyspnea- no Medications Compliance-  no medications Has been exercising.  BMET    Component Value Date/Time   NA 141 09/08/2022 1009   K 4.1 09/08/2022 1009   CL 106 09/08/2022 1009   CO2 28 09/08/2022 1009   GLUCOSE 99 09/08/2022 1009   BUN 24 (H) 09/08/2022 1009   CREATININE 0.93 09/08/2022 1009   CREATININE 0.85 07/01/2021 1516   CALCIUM 9.3 09/08/2022 1009   GFRNONAA 57 (L) 04/16/2022 0601   GFRAA >60 03/01/2016 2059   Insomnia/anxiety/depression: Patient notes that her anxiety and depression have been better since her divorce.  She has some days where the depression is worse.  She notes the trazodone  is helpful for her sleep though she is going to try to reduce back to 50 mg nightly.  She notes no drowsiness in the morning after taking this medication.  Patient reports losing some weight with increasing her exercise at home.  She was taking a medication for weight loss through her OB though she opted to try to lose weight on her own.  Muscle cramps: These continue to be an issue.  They have improved some after stopping the Cymbalta .  Typically occur in her hands and legs.  She notes she does not drink quite enough water.  She did try yellow mustard though this irritated her stomach.  Patient also reports she is getting over a respiratory illness.  She noted upper respiratory congestion and notes this is improving.  Social History   Tobacco Use  Smoking Status Never   Passive exposure: Never  Smokeless Tobacco Never    No current outpatient medications on file prior to visit.   No current facility-administered medications on file prior to visit.     ROS see history of present illness  Objective  Physical Exam Vitals:   03/19/23 0919  BP: 132/80  Pulse: 76  Temp:  97.7 F (36.5 C)  SpO2: 95%    BP Readings from Last 3 Encounters:  03/19/23 132/80  09/08/22 126/82  05/16/22 (!) 155/79   Wt Readings from Last 3 Encounters:  03/19/23 171 lb 9.6 oz (77.8 kg)  09/08/22 192 lb (87.1 kg)  05/16/22 192 lb (87.1 kg)    Physical Exam Constitutional:      General: She is not in acute distress.    Appearance: She is not diaphoretic.  Cardiovascular:     Rate and Rhythm: Normal rate and regular rhythm.     Heart sounds: Normal heart sounds.  Pulmonary:     Effort: Pulmonary effort is normal.     Breath sounds: Normal breath sounds.  Skin:    General: Skin is warm and dry.  Neurological:     Mental Status: She is alert.      Assessment/Plan: Please see individual problem list.  Anxiety and depression Assessment & Plan: Chronic issue.  Generally doing okay.  She will monitor and if anything worsens she will let us  know.  Orders: -     traZODone  HCl; Take 1.5 tablets (75 mg total) by mouth at bedtime.  Dispense: 135 tablet; Refill: 1  Muscle cramps Assessment & Plan: Chronic issue.  Encouraged increased water intake.  Discussed she could try drinking low calorie Gatorade as well.  Discussed stretching as well.  Advised she could take the  Flexeril  5 mg 3 times daily as needed for cramps as well.  Orders: -     Cyclobenzaprine  HCl; Take 1 tablet (5 mg total) by mouth 3 (three) times daily as needed for muscle spasms.  Dispense: 30 tablet; Refill: 1  Primary hypertension Assessment & Plan: Chronic issue.  Blood pressure is generally at goal.  We will continue to monitor.   Pure hypercholesterolemia -     Comprehensive metabolic panel -     Lipid panel  Class 1 obesity due to excess calories with serious comorbidity and body mass index (BMI) of 31.0 to 31.9 in adult Assessment & Plan: Chronic issue.  Weight has trended down.  Encouraged continued exercise changes.  Orders: -     Hemoglobin A1c  Primary insomnia Assessment &  Plan: Chronic issue.  She can continue trazodone  75 mg nightly as needed for sleep though she can also try to reduce back to 50 mg nightly.   Viral URI Assessment & Plan: Improving.  She will monitor for any worsening symptoms.   Other orders -     Meloxicam ; TAKE 1 TABLET BY MOUTH EVERY DAY AS NEEDED FOR PAIN  Dispense: 30 tablet; Refill: 2     Return in about 6 months (around 09/16/2023) for transfer of care.   Camellia Her, MD [facility] Primary Care [facility]

## 2023-03-19 NOTE — Assessment & Plan Note (Signed)
 Chronic issue.  Generally doing okay.  She will monitor and if anything worsens she will let us know.

## 2023-03-19 NOTE — Assessment & Plan Note (Signed)
 Chronic issue.  Encouraged increased water intake.  Discussed she could try drinking low calorie Gatorade as well.  Discussed stretching as well.  Advised she could take the Flexeril 5 mg 3 times daily as needed for cramps as well.

## 2023-03-19 NOTE — Assessment & Plan Note (Signed)
Improving.  She will monitor for any worsening symptoms.

## 2023-03-19 NOTE — Assessment & Plan Note (Signed)
 Chronic issue.  She can continue trazodone 75 mg nightly as needed for sleep though she can also try to reduce back to 50 mg nightly.

## 2023-03-19 NOTE — Assessment & Plan Note (Signed)
 Chronic issue.  Blood pressure is generally at goal.  We will continue to monitor.

## 2023-03-19 NOTE — Assessment & Plan Note (Signed)
 Chronic issue.  Weight has trended down.  Encouraged continued exercise changes.

## 2023-05-03 DIAGNOSIS — G5603 Carpal tunnel syndrome, bilateral upper limbs: Secondary | ICD-10-CM | POA: Diagnosis not present

## 2023-05-03 DIAGNOSIS — M65331 Trigger finger, right middle finger: Secondary | ICD-10-CM | POA: Diagnosis not present

## 2023-05-23 ENCOUNTER — Telehealth: Payer: Self-pay

## 2023-05-23 NOTE — Telephone Encounter (Signed)
 Received request for medial records for surgery from [facility]. Wanted most recent office notes, labs, sleepy study and EKG. Pt doesn't have a sleep study in chart. Everything else was efaxed to Tobie Poet at [phone]

## 2023-05-24 DIAGNOSIS — F419 Anxiety disorder, unspecified: Secondary | ICD-10-CM | POA: Diagnosis not present

## 2023-05-24 DIAGNOSIS — F331 Major depressive disorder, recurrent, moderate: Secondary | ICD-10-CM | POA: Diagnosis not present

## 2023-05-24 DIAGNOSIS — I10 Essential (primary) hypertension: Secondary | ICD-10-CM | POA: Diagnosis not present

## 2023-05-24 DIAGNOSIS — F5104 Psychophysiologic insomnia: Secondary | ICD-10-CM | POA: Diagnosis not present

## 2023-06-06 ENCOUNTER — Telehealth: Payer: Self-pay

## 2023-06-06 NOTE — Telephone Encounter (Signed)
 I called patient to reschedule her [facility] appointment with Dr. Charlann Lange, as provider will be out of office on 09/21/2023.  Patient states she does not have her calendar with her, so she will call us back to reschedule.  E2C2 - when patient calls back, please reschedule her transfer of care appointment with Dr. Charlann Lange.

## 2023-06-15 DIAGNOSIS — F331 Major depressive disorder, recurrent, moderate: Secondary | ICD-10-CM | POA: Diagnosis not present

## 2023-06-15 DIAGNOSIS — F419 Anxiety disorder, unspecified: Secondary | ICD-10-CM | POA: Diagnosis not present

## 2023-06-15 DIAGNOSIS — F5104 Psychophysiologic insomnia: Secondary | ICD-10-CM | POA: Diagnosis not present

## 2023-07-12 DIAGNOSIS — K219 Gastro-esophageal reflux disease without esophagitis: Secondary | ICD-10-CM | POA: Diagnosis not present

## 2023-07-12 DIAGNOSIS — E119 Type 2 diabetes mellitus without complications: Secondary | ICD-10-CM | POA: Diagnosis not present

## 2023-07-12 DIAGNOSIS — F419 Anxiety disorder, unspecified: Secondary | ICD-10-CM | POA: Diagnosis not present

## 2023-07-12 DIAGNOSIS — T148XXA Other injury of unspecified body region, initial encounter: Secondary | ICD-10-CM | POA: Diagnosis not present

## 2023-07-12 DIAGNOSIS — F32A Depression, unspecified: Secondary | ICD-10-CM | POA: Diagnosis not present

## 2023-07-19 DIAGNOSIS — F419 Anxiety disorder, unspecified: Secondary | ICD-10-CM | POA: Diagnosis not present

## 2023-07-19 DIAGNOSIS — F5104 Psychophysiologic insomnia: Secondary | ICD-10-CM | POA: Diagnosis not present

## 2023-07-19 DIAGNOSIS — F331 Major depressive disorder, recurrent, moderate: Secondary | ICD-10-CM | POA: Diagnosis not present

## 2023-07-19 DIAGNOSIS — F3340 Major depressive disorder, recurrent, in remission, unspecified: Secondary | ICD-10-CM | POA: Diagnosis not present

## 2023-07-24 DIAGNOSIS — Z01818 Encounter for other preprocedural examination: Secondary | ICD-10-CM | POA: Diagnosis not present

## 2023-07-27 DIAGNOSIS — Z7984 Long term (current) use of oral hypoglycemic drugs: Secondary | ICD-10-CM | POA: Diagnosis not present

## 2023-07-27 DIAGNOSIS — M65841 Other synovitis and tenosynovitis, right hand: Secondary | ICD-10-CM | POA: Diagnosis not present

## 2023-07-27 DIAGNOSIS — M65331 Trigger finger, right middle finger: Secondary | ICD-10-CM | POA: Diagnosis not present

## 2023-08-10 ENCOUNTER — Telehealth: Payer: Self-pay

## 2023-08-10 NOTE — Telephone Encounter (Signed)
 We received a fax for patient today and I noticed that she does not have a TOC appointment scheduled.  I left a voicemail asking patient to please call us  to schedule this appointment.  E2C2 - when patient calls, please assist her with scheduling her [facility] appointment with one of our providers who are accepting new patients.

## 2023-09-14 ENCOUNTER — Ambulatory Visit: Payer: Federal, State, Local not specified - PPO | Admitting: Family Medicine

## 2023-09-21 ENCOUNTER — Ambulatory Visit

## 2023-09-21 DIAGNOSIS — E7800 Pure hypercholesterolemia, unspecified: Secondary | ICD-10-CM | POA: Diagnosis not present

## 2023-09-21 DIAGNOSIS — Z113 Encounter for screening for infections with a predominantly sexual mode of transmission: Secondary | ICD-10-CM | POA: Diagnosis not present

## 2023-09-21 DIAGNOSIS — Z23 Encounter for immunization: Secondary | ICD-10-CM | POA: Diagnosis not present

## 2023-09-21 DIAGNOSIS — F419 Anxiety disorder, unspecified: Secondary | ICD-10-CM | POA: Diagnosis not present

## 2023-09-21 DIAGNOSIS — Z1231 Encounter for screening mammogram for malignant neoplasm of breast: Secondary | ICD-10-CM | POA: Diagnosis not present

## 2023-09-21 DIAGNOSIS — E119 Type 2 diabetes mellitus without complications: Secondary | ICD-10-CM | POA: Diagnosis not present

## 2023-09-21 DIAGNOSIS — Z130 Encounter for screening for diseases of the blood and blood-forming organs and certain disorders involving the immune mechanism: Secondary | ICD-10-CM | POA: Diagnosis not present

## 2023-09-21 DIAGNOSIS — F32A Depression, unspecified: Secondary | ICD-10-CM | POA: Diagnosis not present

## 2023-09-21 DIAGNOSIS — K219 Gastro-esophageal reflux disease without esophagitis: Secondary | ICD-10-CM | POA: Diagnosis not present

## 2023-09-21 DIAGNOSIS — Z0000 Encounter for general adult medical examination without abnormal findings: Secondary | ICD-10-CM | POA: Diagnosis not present

## 2023-09-28 DIAGNOSIS — E119 Type 2 diabetes mellitus without complications: Secondary | ICD-10-CM | POA: Diagnosis not present

## 2023-09-28 DIAGNOSIS — Z6830 Body mass index (BMI) 30.0-30.9, adult: Secondary | ICD-10-CM | POA: Diagnosis not present

## 2023-09-28 DIAGNOSIS — K219 Gastro-esophageal reflux disease without esophagitis: Secondary | ICD-10-CM | POA: Diagnosis not present

## 2023-11-02 DIAGNOSIS — F411 Generalized anxiety disorder: Secondary | ICD-10-CM | POA: Diagnosis not present

## 2023-11-02 DIAGNOSIS — F5104 Psychophysiologic insomnia: Secondary | ICD-10-CM | POA: Diagnosis not present

## 2023-11-02 DIAGNOSIS — F3340 Major depressive disorder, recurrent, in remission, unspecified: Secondary | ICD-10-CM | POA: Diagnosis not present

## 2023-11-08 DIAGNOSIS — M79674 Pain in right toe(s): Secondary | ICD-10-CM | POA: Diagnosis not present

## 2023-11-08 DIAGNOSIS — E119 Type 2 diabetes mellitus without complications: Secondary | ICD-10-CM | POA: Diagnosis not present

## 2023-11-08 DIAGNOSIS — B351 Tinea unguium: Secondary | ICD-10-CM | POA: Diagnosis not present

## 2023-11-08 DIAGNOSIS — L600 Ingrowing nail: Secondary | ICD-10-CM | POA: Diagnosis not present

## 2023-11-16 DIAGNOSIS — Z1231 Encounter for screening mammogram for malignant neoplasm of breast: Secondary | ICD-10-CM | POA: Diagnosis not present

## 2023-11-16 DIAGNOSIS — R92323 Mammographic fibroglandular density, bilateral breasts: Secondary | ICD-10-CM | POA: Diagnosis not present

## 2023-12-03 DIAGNOSIS — F411 Generalized anxiety disorder: Secondary | ICD-10-CM | POA: Diagnosis not present

## 2023-12-03 DIAGNOSIS — F5104 Psychophysiologic insomnia: Secondary | ICD-10-CM | POA: Diagnosis not present

## 2023-12-03 DIAGNOSIS — F3340 Major depressive disorder, recurrent, in remission, unspecified: Secondary | ICD-10-CM | POA: Diagnosis not present

## 2023-12-31 DIAGNOSIS — F411 Generalized anxiety disorder: Secondary | ICD-10-CM | POA: Diagnosis not present

## 2023-12-31 DIAGNOSIS — F3340 Major depressive disorder, recurrent, in remission, unspecified: Secondary | ICD-10-CM | POA: Diagnosis not present

## 2023-12-31 DIAGNOSIS — F5104 Psychophysiologic insomnia: Secondary | ICD-10-CM | POA: Diagnosis not present

## 2024-01-04 DIAGNOSIS — M79674 Pain in right toe(s): Secondary | ICD-10-CM | POA: Diagnosis not present

## 2024-01-04 DIAGNOSIS — B351 Tinea unguium: Secondary | ICD-10-CM | POA: Diagnosis not present

## 2024-01-04 DIAGNOSIS — M79675 Pain in left toe(s): Secondary | ICD-10-CM | POA: Diagnosis not present

## 2024-01-10 DIAGNOSIS — M2550 Pain in unspecified joint: Secondary | ICD-10-CM | POA: Diagnosis not present

## 2024-01-10 DIAGNOSIS — Z6832 Body mass index (BMI) 32.0-32.9, adult: Secondary | ICD-10-CM | POA: Diagnosis not present

## 2024-02-08 DIAGNOSIS — F411 Generalized anxiety disorder: Secondary | ICD-10-CM | POA: Diagnosis not present

## 2024-02-08 DIAGNOSIS — F5104 Psychophysiologic insomnia: Secondary | ICD-10-CM | POA: Diagnosis not present

## 2024-02-08 DIAGNOSIS — F3340 Major depressive disorder, recurrent, in remission, unspecified: Secondary | ICD-10-CM | POA: Diagnosis not present

## 2024-02-11 ENCOUNTER — Ambulatory Visit (HOSPITAL_COMMUNITY): Payer: Self-pay

## 2024-02-11 ENCOUNTER — Ambulatory Visit: Admission: EM | Admit: 2024-02-11 | Discharge: 2024-02-11 | Disposition: A | Discharge status: Home / Self Care

## 2024-02-11 ENCOUNTER — Ambulatory Visit

## 2024-02-11 ENCOUNTER — Encounter: Payer: Self-pay | Admitting: Emergency Medicine

## 2024-02-11 DIAGNOSIS — J069 Acute upper respiratory infection, unspecified: Secondary | ICD-10-CM

## 2024-02-11 DIAGNOSIS — R058 Other specified cough: Secondary | ICD-10-CM | POA: Diagnosis not present

## 2024-02-11 DIAGNOSIS — R051 Acute cough: Secondary | ICD-10-CM | POA: Diagnosis not present

## 2024-02-11 MED ORDER — PROMETHAZINE-DM 6.25-15 MG/5ML PO SYRP: 5.0000 mL | ORAL_SOLUTION | Freq: Four times a day (QID) | ORAL | 0 refills | Status: AC | PRN

## 2024-02-11 MED ORDER — DOXYCYCLINE HYCLATE 100 MG PO CAPS: 100.0000 mg | ORAL_CAPSULE | Freq: Two times a day (BID) | ORAL | 0 refills | Status: AC

## 2024-02-11 MED ORDER — IPRATROPIUM BROMIDE 0.06 % NA SOLN: 2.0000 | Freq: Four times a day (QID) | NASAL | 12 refills | Status: AC

## 2024-02-11 MED ORDER — AEROCHAMBER MV MISC: 2 refills | Status: AC

## 2024-02-11 MED ORDER — ALBUTEROL SULFATE HFA 108 (90 BASE) MCG/ACT IN AERS: 2.0000 | INHALATION_SPRAY | RESPIRATORY_TRACT | 0 refills | Status: AC | PRN

## 2024-02-11 MED ORDER — BENZONATATE 100 MG PO CAPS: 200.0000 mg | ORAL_CAPSULE | Freq: Three times a day (TID) | ORAL | 0 refills | Status: AC

## 2024-02-11 NOTE — ED Triage Notes (Signed)
 Pt presents with cough, SOB, chest congestion and nasal congestion x 2 days. She had some tessalon  pearls and an old inhaler that she used for her symptoms. At home Covid test was negative.

## 2024-02-11 NOTE — ED Provider Notes (Addendum)
 [facility]    CSN: [id number] Arrival date & time: 02/11/24  1344      History   Chief Complaint Chief Complaint  Patient presents with   Cough   Shortness of Breath   chest congestion    Nasal Congestion    HPI Tammy Gregory is a 63 y.o. female.   HPI  63 year old female with past medical history cervical for hypertension, anxiety and depression, fatty liver, and GERD presents for evaluation of respiratory symptoms that have been going on for a week and worsened over the last 2 days.  She recently traveled back home to be with her father who is hospitalized for pneumonia.  She has been 4 days in the hospital with him.  She started to develop symptoms 6 days ago and they worsened 2 days ago.  She did take a home COVID test that was negative.  She denies fever.  Past Medical History:  Diagnosis Date   Anxiety    Chicken pox    Depression    Fatty liver    GERD (gastroesophageal reflux disease)    Headache    History of kidney stones    Hypertension    Insomnia    Obesity    Pure hypercholesterolemia     Patient Active Problem List   Diagnosis Date Noted   Viral URI 03/19/2023   Muscle cramps 03/14/2022   Arthralgia 03/14/2022   Breast cancer screening 12/12/2019   GERD (gastroesophageal reflux disease) 06/25/2019   Left hip pain 04/18/2019   Left knee pain 07/19/2018   Fatty liver 05/10/2017   Leg edema 05/10/2017   Bilateral carpal tunnel syndrome 05/10/2017   Acute bilateral low back pain with bilateral sciatica 10/10/2016   Insomnia 02/28/2016   Pure hypercholesterolemia 02/24/2016   Anxiety and depression 07/23/2015   Obesity 12/28/2014   Hypertension 12/28/2014   S/P cervical spinal fusion 12/28/2014   Family history of breast cancer 10/11/2011    Past Surgical History:  Procedure Laterality Date   ANTERIOR CERVICAL DECOMP/DISCECTOMY FUSION N/A 06/09/2021   Procedure: Anterior Cervical Discectomy Fusion - Cervical  seven-Thoracic one;  Surgeon: Louis Shove, MD;  Location: [facility] OR;  Service: Neurosurgery;  Laterality: N/A;   CERVICAL FUSION  12/03/2014   CESAREAN SECTION     3   COLONOSCOPY WITH PROPOFOL  N/A 04/02/2015   Procedure: COLONOSCOPY WITH PROPOFOL ;  Surgeon: Gladis RAYMOND Mariner, MD;  Location: [facility] ENDOSCOPY;  Service: Endoscopy;  Laterality: N/A;   CYSTOSCOPY/URETEROSCOPY/HOLMIUM LASER/STENT PLACEMENT Left 05/05/2022   Procedure: CYSTOSCOPY/URETEROSCOPY/HOLMIUM LASER/STENT PLACEMENT;  Surgeon: Francisca Redell BROCKS, MD;  Location: [facility] ORS;  Service: Urology;  Laterality: Left;   TUBAL LIGATION      OB History   No obstetric history on file.      Home Medications    Prior to Admission medications   Medication Sig Start Date End Date Taking? Authorizing Provider  albuterol  (VENTOLIN  HFA) 108 (90 Base) MCG/ACT inhaler Inhale 2 puffs into the lungs every 4 (four) hours as needed. 02/11/24  Yes Bernardino Ditch, NP  benzonatate  (TESSALON ) 100 MG capsule Take 2 capsules (200 mg total) by mouth every 8 (eight) hours. 02/11/24  Yes Bernardino Ditch, NP  doxycycline  (VIBRAMYCIN ) 100 MG capsule Take 1 capsule (100 mg total) by mouth 2 (two) times daily for 7 days. 02/11/24 02/18/24 Yes Bernardino Ditch, NP  ipratropium (ATROVENT ) 0.06 % nasal spray Place 2 sprays into both nostrils 4 (four) times daily. 02/11/24  Yes Bernardino Ditch, NP  metFORMIN  (  GLUCOPHAGE -XR) 500 MG 24 hr tablet Take 500 mg by mouth. 09/28/23 09/27/24 Yes [provider]  Phentermine -Topiramate  ER 7.5-46 MG CP24 Take 1 capsule by mouth every morning. 02/04/24  Yes [provider]  promethazine -dextromethorphan (PROMETHAZINE -DM) 6.25-15 MG/5ML syrup Take 5 mLs by mouth 4 (four) times daily as needed. 02/11/24  Yes Bernardino Ditch, NP  Spacer/Aero-Holding Chambers (AEROCHAMBER MV) inhaler Use as instructed 02/11/24  Yes Bernardino Ditch, NP  traZODone  (DESYREL ) 50 MG tablet Take 1.5 tablets (75 mg total) by mouth at bedtime. 03/19/23  Yes Maribeth Camellia MATSU, MD  cyclobenzaprine  (FLEXERIL ) 5 MG tablet Take 1 tablet (5 mg total) by mouth 3 (three) times daily as needed for muscle spasms. 03/19/23   Maribeth Camellia MATSU, MD  meloxicam  (MOBIC ) 15 MG tablet TAKE 1 TABLET BY MOUTH EVERY DAY AS NEEDED FOR PAIN 03/19/23   Maribeth Camellia MATSU, MD  sertraline (ZOLOFT) 100 MG tablet Take by mouth.    [provider]  sertraline (ZOLOFT) 50 MG tablet TAKE 1.5 TABLETS (75 MG TOTAL) BY MOUTH ONCE DAILY    [provider]    Family History Family History  Problem Relation Age of Onset   Hypertension Father    Hypertension Mother    Breast cancer Cousin        2 mat cousins    Social History Social History   Tobacco Use   Smoking status: Never    Passive exposure: Never   Smokeless tobacco: Never  Vaping Use   Vaping status: Never Used  Substance Use Topics   Alcohol use: Yes    Comment: occ   Drug use: No     Allergies   Codeine, Amoxicillin, and Clindamycin   Review of Systems Review of Systems  Constitutional:  Negative for fever.  HENT:  Positive for congestion. Negative for rhinorrhea and sore throat.   Respiratory:  Positive for cough, chest tightness, shortness of breath and wheezing.      Physical Exam Triage Vital Signs ED Triage Vitals  Encounter Vitals Group     BP      Girls Systolic BP Percentile      Girls Diastolic BP Percentile      Boys Systolic BP Percentile      Boys Diastolic BP Percentile      Pulse      Resp      Temp      Temp src      SpO2      Weight      Height      Head Circumference      Peak Flow      Pain Score      Pain Loc      Pain Education      Exclude from Growth Chart    No data found.  Updated Vital Signs BP 125/66 (BP Location: Left Arm)   Pulse 64   Temp 98.4 F (36.9 C) (Oral)   Resp 16   Wt 170 lb (77.1 kg)   LMP 04/02/2007 (Approximate)   SpO2 99%   BMI 31.09 kg/m   Visual Acuity Right Eye Distance:   Left Eye Distance:   Bilateral  Distance:    Right Eye Near:   Left Eye Near:    Bilateral Near:     Physical Exam Vitals and nursing note reviewed.  Constitutional:      Appearance: Normal appearance. She is not ill-appearing.  HENT:     Head: Normocephalic and atraumatic.  Nose: Congestion and rhinorrhea present.     Comments: These mucosa is erythematous and mildly edematous with scant clear discharge in both nares.    Mouth/Throat:     Mouth: Mucous membranes are moist.     Pharynx: Oropharynx is clear. No oropharyngeal exudate or posterior oropharyngeal erythema.  Cardiovascular:     Rate and Rhythm: Normal rate and regular rhythm.     Pulses: Normal pulses.     Heart sounds: Normal heart sounds. No murmur heard.    No friction rub. No gallop.  Pulmonary:     Effort: Pulmonary effort is normal.     Breath sounds: Normal breath sounds. No wheezing, rhonchi or rales.  Musculoskeletal:     Cervical back: Normal range of motion and neck supple. No tenderness.  Lymphadenopathy:     Cervical: No cervical adenopathy.  Skin:    General: Skin is warm and dry.     Capillary Refill: Capillary refill takes less than 2 seconds.     Findings: No erythema or rash.  Neurological:     General: No focal deficit present.     Mental Status: She is alert and oriented to person, place, and time.      UC Treatments / Results  Labs (all labs ordered are listed, but only abnormal results are displayed) Labs Reviewed - No data to display  EKG   Radiology No results found.  Procedures Procedures (including critical care time)  Medications Ordered in UC Medications - No data to display  Initial Impression / Assessment and Plan / UC Course  I have reviewed the triage vital signs and the nursing notes.  Pertinent labs & imaging results that were available during my care of the patient were reviewed by me and considered in my medical decision making (see chart for details).   Patient is a pleasant,  nontoxic-appearing 63 year old female presenting for evaluation of respiratory symptoms as outlined in HPI above.  Her most significant symptoms are chest tightness with productive cough or rusty sputum that started this morning, shortness breath, and wheezing.  She did takes an old prescription of Tessalon  Perles as well as use an old inhaler of albuterol  with some improvement of her symptoms.  She is able to speak in full sentences without dyspnea or tachypnea.  Respiratory rate at triage was 16 with a 99% room air oxygen saturation.  Her lungs are clear to auscultation all fields.  She was in the hospital with her father who was hospitalized for pneumonia.  Given that she is complaining of rusty sputum production and concerns today to have a pneumonia.  Therefore, I will obtain a chest x-ray to evaluate for any acute cardiopulmonary pathology.  Chest x-ray independently reviewed and evaluated by me.  Impression: Lung fields are well-aerated without evidence of infiltrate or effusion.  Cardiomediastinal silhouette appears normal.  Radiology read is pending. Radiology impression states no active cardiopulmonary disease.  I will discharge patient with diagnosis of URI with cough and congestion.  I will discharge the patient home on doxycycline  100 mg twice daily for 7 days along with Atrovent  nasal spray, Tessalon  Perles, Promethazine  DM cough syrup.  In addition, I will prescribe a albuterol  inhaler and spacer and she can do 1 to 2 puffs every 4-6 hours as needed restrictive breath or wheezing.   Final Clinical Impressions(s) / UC Diagnoses   Final diagnoses:  Acute cough  URI with cough and congestion     Discharge Instructions  Your chest x-ray did not show any evidence of pneumonia.  Your exam is consistent with an upper respiratory tract infection.  Take the doxycycline  twice daily with food for 7 days for treatment of your URI.  Use the albuterol  inhaler, with spacer, take 1 to 2  puffs every 4-6 hours as needed for shortness breath or wheezing.  Use the Atrovent  nasal spray, 2 squirts in each nostril every 6 hours, as needed for runny nose and postnasal drip.  Use the Tessalon  Perles every 8 hours during the day.  Take them with a small sip of water.  They may give you some numbness to the base of your tongue or a metallic taste in your mouth, this is normal.  Use the Promethazine  DM cough syrup at bedtime for cough and congestion.  It will make you drowsy so do not take it during the day.  Return for reevaluation or see your primary care provider for any new or worsening symptoms.      ED Prescriptions     Medication Sig Dispense Auth. Provider   Spacer/Aero-Holding Chambers (AEROCHAMBER MV) inhaler Use as instructed 1 each Bernardino Ditch, NP   albuterol  (VENTOLIN  HFA) 108 (90 Base) MCG/ACT inhaler Inhale 2 puffs into the lungs every 4 (four) hours as needed. 18 g Bernardino Ditch, NP   benzonatate  (TESSALON ) 100 MG capsule Take 2 capsules (200 mg total) by mouth every 8 (eight) hours. 21 capsule Bernardino Ditch, NP   doxycycline  (VIBRAMYCIN ) 100 MG capsule Take 1 capsule (100 mg total) by mouth 2 (two) times daily for 7 days. 14 capsule Bernardino Ditch, NP   ipratropium (ATROVENT ) 0.06 % nasal spray Place 2 sprays into both nostrils 4 (four) times daily. 15 mL Bernardino Ditch, NP   promethazine -dextromethorphan (PROMETHAZINE -DM) 6.25-15 MG/5ML syrup Take 5 mLs by mouth 4 (four) times daily as needed. 118 mL Bernardino Ditch, NP      PDMP not reviewed this encounter.   Bernardino Ditch, NP 02/11/24 1512    Bernardino Ditch, NP 02/11/24 [phone]

## 2024-02-11 NOTE — Discharge Instructions (Signed)
 Your chest x-ray did not show any evidence of pneumonia.  Your exam is consistent with an upper respiratory tract infection.  Take the doxycycline  twice daily with food for 7 days for treatment of your URI.  Use the albuterol  inhaler, with spacer, take 1 to 2 puffs every 4-6 hours as needed for shortness breath or wheezing.  Use the Atrovent  nasal spray, 2 squirts in each nostril every 6 hours, as needed for runny nose and postnasal drip.  Use the Tessalon  Perles every 8 hours during the day.  Take them with a small sip of water.  They may give you some numbness to the base of your tongue or a metallic taste in your mouth, this is normal.  Use the Promethazine  DM cough syrup at bedtime for cough and congestion.  It will make you drowsy so do not take it during the day.  Return for reevaluation or see your primary care provider for any new or worsening symptoms.
